# Patient Record
Sex: Male | Born: 1980
Health system: Southern US, Community
[De-identification: ages and names within clinical notes are randomized; demographics above are authoritative.]

## PROBLEM LIST (undated history)

## (undated) DIAGNOSIS — J9801 Acute bronchospasm: Secondary | ICD-10-CM

## (undated) DIAGNOSIS — Z789 Other specified health status: Secondary | ICD-10-CM

## (undated) HISTORY — DX: Other specified health status: Z78.9

## (undated) HISTORY — DX: Acute bronchospasm: J98.01

## (undated) HISTORY — PX: NO PAST SURGERIES: SHX2092

---

## 2012-04-21 HISTORY — PX: CYST REMOVAL TRUNK: SHX6283

## 2016-02-01 ENCOUNTER — Encounter: Payer: Self-pay | Admitting: Family Medicine

## 2016-02-01 ENCOUNTER — Ambulatory Visit (INDEPENDENT_AMBULATORY_CARE_PROVIDER_SITE_OTHER): Payer: BLUE CROSS/BLUE SHIELD | Admitting: Family Medicine

## 2016-02-01 VITALS — BP 108/74 | HR 77 | Temp 98.0°F | Resp 18 | Ht 70.5 in | Wt 164.8 lb

## 2016-02-01 DIAGNOSIS — Z7689 Persons encountering health services in other specified circumstances: Secondary | ICD-10-CM

## 2016-02-01 DIAGNOSIS — J209 Acute bronchitis, unspecified: Secondary | ICD-10-CM | POA: Diagnosis not present

## 2016-02-01 MED ORDER — AMOXICILLIN-POT CLAVULANATE 875-125 MG PO TABS: 1.0000 | ORAL_TABLET | Freq: Two times a day (BID) | ORAL | 0 refills | Status: DC

## 2016-02-01 NOTE — Progress Notes (Signed)
Patient ID: Gregory Knight, male  DOB: 14-Oct-1980, 35 y.o.   MRN: [id number] Patient Care Team    Relationship Specialty Notifications Start End  Natalia Leatherwoodenee A Kuneff, DO PCP - General Family Medicine  02/01/16     Subjective:  Gregory Knight is a 35 y.o.  male present for new patient establishment. All past medical history, surgical history, allergies, family history, immunizations, medications and social history were obtained and entered in the electronic medical record today. All recent labs, ED visits and hospitalizations within the last year were reviewed.  Sore throat: pt presents for new pt establishment with complaints of a  1 week history of  productive cough, headache, sore throat, hoarseness. He denies nasal congestion, runny nose, fever, chills, nausea , vomit or diarrhea. He is eating and drinking well. He has not tried anything to improve his symptoms.   Health maintenance:  Colonoscopy: no Fhx, screen at 50 Immunizations: tdap 2015, Influenza 2015 (encouraged yearly) Infectious disease screening: HIV unknown PSA: No results found for: PSA, No Fx.    Immunization History  Administered Date(s) Administered  . Influenza-Unspecified 01/19/2014     Past Medical History:  Diagnosis Date  . Medical history non-contributory    No Known Allergies Past Surgical History:  Procedure Laterality Date  . NO PAST SURGERIES     Family History  Problem Relation Age of Onset  . Cancer Father     Bile duct   . Stroke Paternal Aunt   . Cancer Paternal Uncle     Bile duct    Social History   Social History  . Marital status: Married    Spouse name: Can Aura CampsCui  . Number of children: 2  . Years of education: PhD   Occupational History  . Actuary    Social History Main Topics  . Smoking status: Never Smoker  . Smokeless tobacco: Never Used  . Alcohol use No  . Drug use: No  . Sexual activity: Yes    Partners: Female    Birth control/ protection: Condom     Comment: married    Other Topics Concern  . Not on file   Social History Narrative   Married to can [place] Crossroadsui, 2 children Zhiyi and CelinaAustin.    PhD, works as an Dance movement psychotherapistActuary.    Wears his seatbelt, bicycle helmet.    Exercises routinely.    Smoke detector in the home.   Feels safe in the relationship.             Medication List       Accurate as of 02/01/16  6:04 PM. Always use your most recent med list.          amoxicillin-clavulanate 875-125 MG tablet Commonly known as:  AUGMENTIN Take 1 tablet by mouth 2 (two) times daily.        No results found for this or any previous visit (from the past 2160 hour(s)).  Patient was never admitted.   ROS: 14 pt review of systems performed and negative (unless mentioned in an HPI)  Objective: BP 108/74 (BP Location: Right Arm, Patient Position: Sitting, Cuff Size: Normal)   Pulse 77   Temp 98 F (36.7 C)   Resp 18   Ht 5' 10.5" (1.791 m)   Wt 164 lb 12 oz (74.7 kg)   SpO2 98%   BMI 23.31 kg/m  Gen: Afebrile. No acute distress. Nontoxic in appearance, well-developed, well-nourished,  Pleasant asian male.  HENT: AT. [place]. Bilateral TM  visualized and normal in appearance, normal external auditory canal. MMM, no oral lesions. Bilateral nares with erythema and swelling, drainage noted.. Throat without erythema, ulcerations or exudates. mild Cough on exam, mild hoarseness on exam. No TTP sinus cavities.  Eyes:Pupils Equal Round Reactive to light, Extraocular movements intact,  Conjunctiva without redness, discharge or icterus. Neck/lymp/endocrine: Supple,no lymphadenopathy CV: RRR  Chest: CTAB, no wheeze, rhonchi or crackles. Abd: Soft. NTND. BS present.  Skin: no rashes, purpura or petechiae. Warm and well-perfused. Skin intact. Neuro/Msk: Normal gait. PERLA. EOMi. Alert. Oriented x3.     Assessment/plan: Gregory Knight is a 35 y.o. male present for establishment of care with acute bronchitis.  Acute bronchitis, unspecified organism - Flonase, mucinex  DM, rest, hydrate - amoxicillin-clavulanate (AUGMENTIN) 875-125 MG tablet; Take 1 tablet by mouth 2 (two) times daily.  Dispense: 20 tablet; Refill: 0 - F/u PRN   Return in about 4 weeks (around 02/29/2016) for CPE. Greater than 30 minutes spent with patient, >50% of time spent face to face counseling patient and coordinating care.  Electronically signed by: Felix Pacini, DO [facility]

## 2016-02-01 NOTE — Patient Instructions (Signed)
It was a pleasure meeting you today. Please tell your wife hello from me.   I think you have bronchitis. I have called in an antibiotic called Augmentin for you to take every 12 hours for 10 days.  I also would suggest you start FLONASE nasal spray daily for about 3-4 weeks.  Use MUCINEX DM for cough and drainage.   Follow up in 2 weeks if not feeling better.      Acute Bronchitis Bronchitis is inflammation of the airways that extend from the windpipe into the lungs (bronchi). The inflammation often causes mucus to develop. This leads to a cough, which is the most common symptom of bronchitis.  In acute bronchitis, the condition usually develops suddenly and goes away over time, usually in a couple weeks. Smoking, allergies, and asthma can make bronchitis worse. Repeated episodes of bronchitis may cause further lung problems.  CAUSES Acute bronchitis is most often caused by the same virus that causes a cold. The virus can spread from person to person (contagious) through coughing, sneezing, and touching contaminated objects. SIGNS AND SYMPTOMS   Cough.   Fever.   Coughing up mucus.   Body aches.   Chest congestion.   Chills.   Shortness of breath.   Sore throat.  DIAGNOSIS  Acute bronchitis is usually diagnosed through a physical exam. Your health care provider will also ask you questions about your medical history. Tests, such as chest X-rays, are sometimes done to rule out other conditions.  TREATMENT  Acute bronchitis usually goes away in a couple weeks. Oftentimes, no medical treatment is necessary. Medicines are sometimes given for relief of fever or cough. Antibiotic medicines are usually not needed but may be prescribed in certain situations. In some cases, an inhaler may be recommended to help reduce shortness of breath and control the cough. A cool mist vaporizer may also be used to help thin bronchial secretions and make it easier to clear the chest.  HOME CARE  INSTRUCTIONS  Get plenty of rest.   Drink enough fluids to keep your urine clear or pale yellow (unless you have a medical condition that requires fluid restriction). Increasing fluids may help thin your respiratory secretions (sputum) and reduce chest congestion, and it will prevent dehydration.   Take medicines only as directed by your health care provider.  If you were prescribed an antibiotic medicine, finish it all even if you start to feel better.  Avoid smoking and secondhand smoke. Exposure to cigarette smoke or irritating chemicals will make bronchitis worse. If you are a smoker, consider using nicotine gum or skin patches to help control withdrawal symptoms. Quitting smoking will help your lungs heal faster.   Reduce the chances of another bout of acute bronchitis by washing your hands frequently, avoiding people with cold symptoms, and trying not to touch your hands to your mouth, nose, or eyes.   Keep all follow-up visits as directed by your health care provider.  SEEK MEDICAL CARE IF: Your symptoms do not improve after 1 week of treatment.  SEEK IMMEDIATE MEDICAL CARE IF:  You develop an increased fever or chills.   You have chest pain.   You have severe shortness of breath.  You have bloody sputum.   You develop dehydration.  You faint or repeatedly feel like you are going to pass out.  You develop repeated vomiting.  You develop a severe headache. MAKE SURE YOU:   Understand these instructions.  Will watch your condition.  Will get help right  away if you are not doing well or get worse.   This information is not intended to replace advice given to you by your health care provider. Make sure you discuss any questions you have with your health care provider.   Document Released: 05/15/2004 Document Revised: 04/28/2014 Document Reviewed: 09/28/2012 Elsevier Interactive Patient Education [employer].

## 2016-02-27 ENCOUNTER — Ambulatory Visit (INDEPENDENT_AMBULATORY_CARE_PROVIDER_SITE_OTHER): Payer: BLUE CROSS/BLUE SHIELD | Admitting: Family Medicine

## 2016-02-27 ENCOUNTER — Encounter: Payer: Self-pay | Admitting: Family Medicine

## 2016-02-27 VITALS — BP 121/80 | HR 58 | Temp 97.7°F | Resp 20 | Ht 71.0 in | Wt 168.8 lb

## 2016-02-27 DIAGNOSIS — Z0000 Encounter for general adult medical examination without abnormal findings: Secondary | ICD-10-CM | POA: Diagnosis not present

## 2016-02-27 DIAGNOSIS — R7989 Other specified abnormal findings of blood chemistry: Secondary | ICD-10-CM

## 2016-02-27 DIAGNOSIS — Z13220 Encounter for screening for lipoid disorders: Secondary | ICD-10-CM | POA: Diagnosis not present

## 2016-02-27 DIAGNOSIS — Z130 Encounter for screening for diseases of the blood and blood-forming organs and certain disorders involving the immune mechanism: Secondary | ICD-10-CM | POA: Diagnosis not present

## 2016-02-27 DIAGNOSIS — Z114 Encounter for screening for human immunodeficiency virus [HIV]: Secondary | ICD-10-CM

## 2016-02-27 DIAGNOSIS — Z131 Encounter for screening for diabetes mellitus: Secondary | ICD-10-CM

## 2016-02-27 DIAGNOSIS — Z800 Family history of malignant neoplasm of digestive organs: Secondary | ICD-10-CM | POA: Diagnosis not present

## 2016-02-27 DIAGNOSIS — Z23 Encounter for immunization: Secondary | ICD-10-CM

## 2016-02-27 LAB — CBC WITH DIFFERENTIAL/PLATELET
BASOS ABS: 0 10*3/uL (ref 0.0–0.1)
Basophils Relative: 0.1 % (ref 0.0–3.0)
EOS ABS: 0.1 10*3/uL (ref 0.0–0.7)
Eosinophils Relative: 1.4 % (ref 0.0–5.0)
HEMATOCRIT: 48.8 % (ref 39.0–52.0)
HEMOGLOBIN: 16.7 g/dL (ref 13.0–17.0)
LYMPHS PCT: 30.4 % (ref 12.0–46.0)
Lymphs Abs: 2.1 10*3/uL (ref 0.7–4.0)
MCHC: 34.3 g/dL (ref 30.0–36.0)
MCV: 93.3 fl (ref 78.0–100.0)
MONOS PCT: 5.4 % (ref 3.0–12.0)
Monocytes Absolute: 0.4 10*3/uL (ref 0.1–1.0)
Neutro Abs: 4.4 10*3/uL (ref 1.4–7.7)
Neutrophils Relative %: 62.7 % (ref 43.0–77.0)
Platelets: 216 10*3/uL (ref 150.0–400.0)
RBC: 5.22 Mil/uL (ref 4.22–5.81)
RDW: 12.4 % (ref 11.5–15.5)
WBC: 7 10*3/uL (ref 4.0–10.5)

## 2016-02-27 LAB — COMPREHENSIVE METABOLIC PANEL
ALBUMIN: 5.1 g/dL (ref 3.5–5.2)
ALK PHOS: 71 U/L (ref 39–117)
ALT: 61 U/L — AB (ref 0–53)
AST: 54 U/L — AB (ref 0–37)
BILIRUBIN TOTAL: 1 mg/dL (ref 0.2–1.2)
BUN: 9 mg/dL (ref 6–23)
CALCIUM: 10.2 mg/dL (ref 8.4–10.5)
CO2: 32 meq/L (ref 19–32)
CREATININE: 0.87 mg/dL (ref 0.40–1.50)
Chloride: 101 mEq/L (ref 96–112)
GFR: 105.51 mL/min (ref >60.00)
Glucose, Bld: 92 mg/dL (ref 70–99)
Potassium: 4.6 mEq/L (ref 3.5–5.1)
Sodium: 140 mEq/L (ref 135–145)
TOTAL PROTEIN: 7.8 g/dL (ref 6.0–8.3)

## 2016-02-27 LAB — URINALYSIS, ROUTINE W REFLEX MICROSCOPIC
BILIRUBIN URINE: NEGATIVE
Ketones, ur: NEGATIVE
LEUKOCYTES UA: NEGATIVE
NITRITE: NEGATIVE
Specific Gravity, Urine: 1.015 (ref 1.000–1.030)
TOTAL PROTEIN, URINE-UPE24: NEGATIVE
URINE GLUCOSE: NEGATIVE
UROBILINOGEN UA: 0.2 (ref 0.0–1.0)
WBC, UA: NONE SEEN (ref 0–?)
pH: 6 (ref 5.0–8.0)

## 2016-02-27 LAB — HEMOGLOBIN A1C: Hgb A1c MFr Bld: 5.9 % (ref 4.6–6.5)

## 2016-02-27 LAB — LDL CHOLESTEROL, DIRECT: LDL DIRECT: 142 mg/dL

## 2016-02-27 LAB — LIPID PANEL
CHOLESTEROL: 227 mg/dL — AB (ref 0–200)
HDL: 51.4 mg/dL (ref >39.00)
NONHDL: 176.03
TRIGLYCERIDES: 276 mg/dL — AB (ref 0.0–149.0)
Total CHOL/HDL Ratio: 4
VLDL: 55.2 mg/dL — ABNORMAL HIGH (ref 0.0–40.0)

## 2016-02-27 NOTE — Patient Instructions (Signed)

## 2016-02-27 NOTE — Progress Notes (Signed)
Patient ID: Gregory Knight, male  DOB: 13-Aug-1980, 35 y.o.   MRN: [id number] Patient Care Team    Relationship Specialty Notifications Start End  Ma Hillock, DO PCP - General Family Medicine  02/01/16     Subjective:  Gregory Knight is a 35 y.o. male present for CPE. All past medical history, surgical history, allergies, family history, immunizations, medications and social history were updated in the electronic medical record today. All recent labs, ED visits and hospitalizations within the last year were reviewed.  Health maintenance:  Colonoscopy: No FHX colon cancer, bile duct cancer present in > 2 family members at age 80.  Immunizations:  tdap UTD 2015, influenza administered today Infectious disease screening: HIV completed today PSA: No Fhx Assistive device: no Oxygen use:no Patient has a Dental home. Hospitalizations/ED visits: No  Immunization History  Administered Date(s) Administered  . Influenza,inj,Quad PF,36+ Mos 02/27/2016  . Influenza-Unspecified 01/19/2014     Past Medical History:  Diagnosis Date  . Medical history non-contributory    No Known Allergies Past Surgical History:  Procedure Laterality Date  . NO PAST SURGERIES     Family History  Problem Relation Age of Onset  . Cancer Father 33    Bile duct   . Stroke Paternal Aunt   . Cancer Paternal Uncle 26    Bile duct    Social History   Social History  . Marital status: Married    Spouse name: Can Gretchen Short  . Number of children: 2  . Years of education: PhD   Occupational History  . Actuary    Social History Main Topics  . Smoking status: Never Smoker  . Smokeless tobacco: Never Used  . Alcohol use No  . Drug use: No  . Sexual activity: Yes    Partners: Female    Birth control/ protection: Condom     Comment: married   Other Topics Concern  . Not on file   Social History Narrative   Married to can [place], 2 children Zhiyi and Nina.    PhD, works as an Marine scientist.    Wears his  seatbelt, bicycle helmet.    Exercises routinely.    Smoke detector in the home.   Feels safe in the relationship.             Medication List    as of 02/27/2016  9:04 AM   You have not been prescribed any medications.      No results found for this or any previous visit (from the past 2160 hour(s)).  Patient was never admitted.   ROS: 14 pt review of systems performed and negative (unless mentioned in an HPI)  Objective: BP 121/80 (BP Location: Right Arm, Patient Position: Sitting, Cuff Size: Normal)   Pulse (!) 58   Temp 97.7 F (36.5 C)   Resp 20   Ht '5\' 11"'  (1.803 m)   Wt 168 lb 12 oz (76.5 kg)   SpO2 99%   BMI 23.54 kg/m  Gen: Afebrile. No acute distress. Nontoxic in appearance, well-developed, well-nourished,  Pleasant asian male.  HENT: AT. [place]. Bilateral TM visualized and normal in appearance, normal external auditory canal. MMM, no oral lesions, adequate dentition. Bilateral nares within normal limits. Throat without erythema, ulcerations or exudates. no Cough on exam, no hoarseness on exam. Eyes:Pupils Equal Round Reactive to light, Extraocular movements intact,  Conjunctiva without redness, discharge or icterus. Neck/lymp/endocrine: Supple,no lymphadenopathy, no thyromegaly CV: RRR no murmur, no edema, +2/4 P posterior  tibialis pulses. no carotid bruitsno. No JVD. Chest: CTAB, no wheeze, rhonchi or crackles. Normal  Respiratory effort. good Air movement. Abd: Soft. flat. NTND. BS present. no Masses palpated. No hepatosplenomegaly. No rebound tenderness or guarding. Skin: no rashes, purpura or petechiae. Warm and well-perfused. Skin intact. Neuro/Msk:  Normal gait. PERLA. EOMi. Alert. Oriented x3.  Cranial nerves II through XII intact. Muscle strength 5/5 upper/lower extremity. DTRs equal bilaterally. Psych: Normal affect, dress and demeanor. Normal speech. Normal thought content and judgment.   Assessment/plan: Gregory Knight is a 35 y.o. male present for  CPE Encounter for preventive health examination Patient was encouraged to exercise greater than 150 minutes a week. Patient was encouraged to choose a diet filled with fresh fruits and vegetables, and lean meats. AVS provided to patient today for education/recommendation on gender specific health and safety maintenance. Colonoscopy: No FHX colon cancer, bile duct cancer present in > 2 family members at age 73.  Immunizations:  tdap UTD 2015, influenza administered today Infectious disease screening: HIV completed today PSA: No Fhx Screening cholesterol level - Comp Met (CMET) - Lipid panel Diabetes mellitus screening - HgB A1c Screening for deficiency anemia - CBC w/Diff Encounter for screening for HIV - HIV antibody (with reflex) Family history of cancer of extrahepatic bile ducts - Urinalysis, Routine w reflex microscopic Need for prophylactic vaccination and inoculation against influenza - Flu Vaccine QUAD 36+ mos PF IM (Fluarix & Fluzone Quad PF)  Return in about 1 year (around 02/26/2017) for CPE.  Electronically signed by: Howard Pouch, DO [place]

## 2016-02-28 ENCOUNTER — Telehealth: Payer: Self-pay | Admitting: Family Medicine

## 2016-02-28 DIAGNOSIS — Z800 Family history of malignant neoplasm of digestive organs: Secondary | ICD-10-CM

## 2016-02-28 DIAGNOSIS — R748 Abnormal levels of other serum enzymes: Secondary | ICD-10-CM | POA: Insufficient documentation

## 2016-02-28 LAB — HIV ANTIBODY (ROUTINE TESTING W REFLEX): HIV 1&2 Ab, 4th Generation: NONREACTIVE

## 2016-02-28 NOTE — Telephone Encounter (Signed)
Please call pt:  - his labs have resulted with elevated liver enzymes, elevated cholesterol and elevated diabetes screen (but not diabetic range).  - His LFTs are only mildly elevated but considering his fhx of bile duct cancer, I have went ahead and ordered an US of his liver. He will need a follow appt with me 2 days after US completed to review all labs and imaging reports and discuss plan (30 minute slot). It is important for him to understand there are many causes of mildly elevated LFT, including higher cholesterol. We are being more cautious and moving quickly in work up with him, because of his fhx,   Please place order for US ABD complete, once pt is contacted with the 2 problems in his problem list associated at [facility].

## 2016-02-29 NOTE — Telephone Encounter (Signed)
Spoke with patient recommendation is to get US due to family history .

## 2016-02-29 NOTE — Telephone Encounter (Signed)
Patient returning call to Gregory ChessmanSuzanne, advised she was with a patient at the time.  Please return call to patient at [phone].

## 2016-02-29 NOTE — Telephone Encounter (Signed)
Order for US placed

## 2016-02-29 NOTE — Telephone Encounter (Signed)
Called patient  Mailbox full unable to leave message

## 2016-02-29 NOTE — Telephone Encounter (Signed)
Spoke with patient reviewed lab results and information. 

## 2016-02-29 NOTE — Telephone Encounter (Signed)
Patient calling to inquire if it is medically necessary for him to have his abdominal ultrasound performed now or would it be ok for him to wait until January to have it done.

## 2016-03-02 ENCOUNTER — Ambulatory Visit (HOSPITAL_BASED_OUTPATIENT_CLINIC_OR_DEPARTMENT_OTHER)
Admission: RE | Admit: 2016-03-02 | Discharge: 2016-03-02 | Disposition: A | Discharge status: Home / Self Care | Payer: BLUE CROSS/BLUE SHIELD | Source: Ambulatory Visit | Attending: Family Medicine | Admitting: Family Medicine

## 2016-03-02 DIAGNOSIS — K760 Fatty (change of) liver, not elsewhere classified: Secondary | ICD-10-CM | POA: Insufficient documentation

## 2016-03-02 DIAGNOSIS — Z800 Family history of malignant neoplasm of digestive organs: Secondary | ICD-10-CM | POA: Diagnosis present

## 2016-03-02 DIAGNOSIS — R748 Abnormal levels of other serum enzymes: Secondary | ICD-10-CM | POA: Diagnosis present

## 2016-03-03 ENCOUNTER — Telehealth: Payer: Self-pay | Admitting: Family Medicine

## 2016-03-03 NOTE — Telephone Encounter (Signed)
Please make certain pt hs f/u schedule for US results and discuss abnormal labs and recommendations.

## 2016-03-04 NOTE — Telephone Encounter (Signed)
appt scheduled

## 2016-03-07 ENCOUNTER — Ambulatory Visit (INDEPENDENT_AMBULATORY_CARE_PROVIDER_SITE_OTHER): Payer: BLUE CROSS/BLUE SHIELD | Admitting: Family Medicine

## 2016-03-07 ENCOUNTER — Encounter: Payer: Self-pay | Admitting: Family Medicine

## 2016-03-07 VITALS — BP 117/82 | HR 72 | Temp 98.6°F | Resp 16 | Ht 71.0 in | Wt 167.0 lb

## 2016-03-07 DIAGNOSIS — Z800 Family history of malignant neoplasm of digestive organs: Secondary | ICD-10-CM | POA: Diagnosis not present

## 2016-03-07 DIAGNOSIS — R748 Abnormal levels of other serum enzymes: Secondary | ICD-10-CM

## 2016-03-07 DIAGNOSIS — K760 Fatty (change of) liver, not elsewhere classified: Secondary | ICD-10-CM

## 2016-03-07 DIAGNOSIS — E785 Hyperlipidemia, unspecified: Secondary | ICD-10-CM | POA: Diagnosis not present

## 2016-03-07 DIAGNOSIS — J9801 Acute bronchospasm: Secondary | ICD-10-CM

## 2016-03-07 DIAGNOSIS — R7303 Prediabetes: Secondary | ICD-10-CM

## 2016-03-07 MED ORDER — ALBUTEROL SULFATE HFA 108 (90 BASE) MCG/ACT IN AERS
2.0000 | INHALATION_SPRAY | Freq: Four times a day (QID) | RESPIRATORY_TRACT | 0 refills | Status: DC | PRN
Start: 2016-03-07 — End: 2017-11-10

## 2016-03-07 NOTE — Patient Instructions (Signed)
Repeat labs in 4-6 months.  Repeat Ultrasound in 1 years.  Lower carbohydrate/sweets in your diet, exercise > 150 minutes a week.  Start fish oil 2g daily.

## 2016-03-07 NOTE — Progress Notes (Signed)
Gregory Knight , 1981-03-19, 35 y.o., male MRN: [id number] Patient Care Team    Relationship Specialty Notifications Start End  Natalia Leatherwoodenee A Kuneff, DO PCP - General Family Medicine  02/01/16     CC: abnormal labs and US Subjective: Pt presents for an OV to discuss his abnormal labs and abnormal abdominal US.   Abnormal LFT/ hyperlipidemia/FH bile duct cancer/ abnormal US: On cpe labs pt found to have elevated LFT 54/61 (ast/ALT), elevated cholesterol 227, trig 276, LDL 142. Urine negative for bili, or abnormality. Pt is symptomatic denies abdominal pain, bloating or changes in urine or stool. He has a rather strong FH with multiple members passing away from bile duct cancer, of which kind he is uncertain. US with increased echogenicity, no intrahepatic lesions and a bile duct diameter of 5.8 mm.   Prediabetes: pt found to have abnormal a1c on 5.9 on CPE exam. He denies polydipsia, polyphagia or polyuria. He reported a "darker yellow" colored urine on exam. Urine was normal.   ABD US 03/02/2016: EXAM: ABDOMEN ULTRASOUND COMPLETE COMPARISON:  None. FINDINGS: Gallbladder: No gallstones or wall thickening visualized. No sonographic Murphy sign noted by sonographer. Common bile duct: Diameter: 5.8 mm Liver: Diffusely increased in echogenicity. No focal lesion identified. IVC: No abnormality visualized. Pancreas: Visualized portion unremarkable. Spleen: Size and appearance within normal limits. Right Kidney: Length: 11.7 cm. Echogenicity within normal limits. No mass or hydronephrosis visualized. Left Kidney: Length: 12.1 cm. Echogenicity within normal limits. No mass or hydronephrosis visualized. Abdominal aorta: No aneurysm visualized. Other findings: None. IMPRESSION: Hepatic steatosis. No cholelithiasis or sonographic evidence for acute cholecystitis  No Known Allergies Social History  Substance Use Topics  . Smoking status: Never Smoker  . Smokeless tobacco: Never Used  .  Alcohol use No   Past Medical History:  Diagnosis Date  . Bronchospasm   . Medical history non-contributory    Past Surgical History:  Procedure Laterality Date  . NO PAST SURGERIES     Family History  Problem Relation Age of Onset  . Cancer Father 3350    Bile duct   . Stroke Paternal Aunt   . Cancer Paternal Uncle 1350    Bile duct      Medication List       Accurate as of 03/07/16  6:16 PM. Always use your most recent med list.          albuterol 108 (90 Base) MCG/ACT inhaler Commonly known as:  PROVENTIL HFA;VENTOLIN HFA Inhale 2 puffs into the lungs every 6 (six) hours as needed for wheezing or shortness of breath.       No results found for this or any previous visit (from the past 24 hour(s)). No results found.   ROS: Negative, with the exception of above mentioned in HPI   Objective:  BP 117/82 (BP Location: Left Arm, Patient Position: Sitting, Cuff Size: Normal)   Pulse 72   Temp 98.6 F (37 C) (Temporal)   Resp 16   Ht 5\' 11"  (1.803 m)   Wt 167 lb (75.8 kg)   SpO2 96%   BMI 23.29 kg/m  Body mass index is 23.29 kg/m. Gen: Afebrile. No acute distress. Nontoxic in appearance, well developed, well nourished.  HENT: AT. [place]. MMM, no oral lesions.  Eyes:Pupils Equal Round Reactive to light, Extraocular movements intact,  Conjunctiva without redness, discharge or icterus. Neuro:  Normal gait. PERLA. EOMi. Alert. Oriented x3  Psych: Normal affect, dress and demeanor. Normal speech. Normal thought content and  judgment.   Assessment/Plan: Gregory Knight is a 35 y.o. male present for  OV for   Bronchospasm - discussed treatment for intermittent bronchospasm with albuterol. He had flovent and used PRN.  - albuterol prescribed, if needing greater than 2x a week, will need to consider PFT.   Elevated liver enzymes/Family history of malignant neoplasm of bile ducts Hyperlipidemia LDL goal <100 Hepatic steatosis - discussed results with pt in detail. US without  intrahepatic lesion, however bile duct diameter is 5.8. Discussed reccommended cut-off for concern > 6 mm. - recommended he consider Hep B vaccine series if he has not already received. He will let us know if he wants to start after thinking about it.   - encouraged him to make dietary and exercise changes.  - avoidance of liver toxic agents, he does not drink any alcohol.  - Start fish oil supplement 2g daily.  - repeat LFT in 4-6 months. Monitor for any changes in urine, bowels or abd pain.  - rpt US in 1 year. Sooner if LFT worsening on repeat, consider GI referral.    Prediabetes - a1c 5.9 - discussed low carb/sugar diet. Healthy carbs.  - increase exercise to > 150 minutes a week - F/u 4-6 months for repeat.    > 25 minutes spent with patient, >50% of time spent face to face   electronically signed by:  Felix Pacinienee Kuneff, DO  [facility]

## 2016-03-07 NOTE — Progress Notes (Signed)
Pre visit review using our clinic review tool, if applicable. No additional management support is needed unless otherwise documented below in the visit note. 

## 2017-11-10 ENCOUNTER — Ambulatory Visit (INDEPENDENT_AMBULATORY_CARE_PROVIDER_SITE_OTHER): Payer: BLUE CROSS/BLUE SHIELD | Admitting: Family Medicine

## 2017-11-10 ENCOUNTER — Encounter: Payer: Self-pay | Admitting: Family Medicine

## 2017-11-10 ENCOUNTER — Telehealth: Payer: Self-pay | Admitting: Family Medicine

## 2017-11-10 VITALS — BP 103/68 | HR 72 | Temp 97.7°F | Resp 20 | Ht 71.0 in | Wt 151.0 lb

## 2017-11-10 DIAGNOSIS — E785 Hyperlipidemia, unspecified: Secondary | ICD-10-CM

## 2017-11-10 DIAGNOSIS — K760 Fatty (change of) liver, not elsewhere classified: Secondary | ICD-10-CM

## 2017-11-10 DIAGNOSIS — R748 Abnormal levels of other serum enzymes: Secondary | ICD-10-CM

## 2017-11-10 DIAGNOSIS — R7303 Prediabetes: Secondary | ICD-10-CM | POA: Diagnosis not present

## 2017-11-10 DIAGNOSIS — Z800 Family history of malignant neoplasm of digestive organs: Secondary | ICD-10-CM

## 2017-11-10 DIAGNOSIS — Z130 Encounter for screening for diseases of the blood and blood-forming organs and certain disorders involving the immune mechanism: Secondary | ICD-10-CM

## 2017-11-10 DIAGNOSIS — Z0000 Encounter for general adult medical examination without abnormal findings: Secondary | ICD-10-CM

## 2017-11-10 LAB — CBC WITH DIFFERENTIAL/PLATELET
Basophils Absolute: 0 10*3/uL (ref 0.0–0.1)
Basophils Relative: 0.1 % (ref 0.0–3.0)
EOS ABS: 0.1 10*3/uL (ref 0.0–0.7)
EOS PCT: 1.2 % (ref 0.0–5.0)
HCT: 43.6 % (ref 39.0–52.0)
Hemoglobin: 15.2 g/dL (ref 13.0–17.0)
LYMPHS ABS: 1.9 10*3/uL (ref 0.7–4.0)
Lymphocytes Relative: 36.1 % (ref 12.0–46.0)
MCHC: 34.9 g/dL (ref 30.0–36.0)
MCV: 92.8 fl (ref 78.0–100.0)
MONO ABS: 0.3 10*3/uL (ref 0.1–1.0)
Monocytes Relative: 5 % (ref 3.0–12.0)
NEUTROS PCT: 57.6 % (ref 43.0–77.0)
Neutro Abs: 3 10*3/uL (ref 1.4–7.7)
Platelets: 221 10*3/uL (ref 150.0–400.0)
RBC: 4.69 Mil/uL (ref 4.22–5.81)
RDW: 12.2 % (ref 11.5–15.5)
WBC: 5.2 10*3/uL (ref 4.0–10.5)

## 2017-11-10 LAB — COMPREHENSIVE METABOLIC PANEL
ALT: 16 U/L (ref 0–53)
AST: 12 U/L (ref 0–37)
Albumin: 4.7 g/dL (ref 3.5–5.2)
Alkaline Phosphatase: 78 U/L (ref 39–117)
BUN: 12 mg/dL (ref 6–23)
CHLORIDE: 102 meq/L (ref 96–112)
CO2: 33 mEq/L — ABNORMAL HIGH (ref 19–32)
Calcium: 9.6 mg/dL (ref 8.4–10.5)
Creatinine, Ser: 0.87 mg/dL (ref 0.40–1.50)
GFR: 104.53 mL/min (ref >60.00)
GLUCOSE: 102 mg/dL — AB (ref 70–99)
POTASSIUM: 3.9 meq/L (ref 3.5–5.1)
SODIUM: 140 meq/L (ref 135–145)
Total Bilirubin: 0.7 mg/dL (ref 0.2–1.2)
Total Protein: 7.4 g/dL (ref 6.0–8.3)

## 2017-11-10 LAB — LIPID PANEL
Cholesterol: 170 mg/dL (ref 0–200)
HDL: 35.8 mg/dL — ABNORMAL LOW (ref >39.00)
Total CHOL/HDL Ratio: 5
Triglycerides: 405 mg/dL — ABNORMAL HIGH (ref 0.0–149.0)

## 2017-11-10 LAB — HEMOGLOBIN A1C: HEMOGLOBIN A1C: 5.8 % (ref 4.6–6.5)

## 2017-11-10 LAB — LDL CHOLESTEROL, DIRECT: Direct LDL: 83 mg/dL

## 2017-11-10 LAB — TSH: TSH: 1.33 u[IU]/mL (ref 0.35–4.50)

## 2017-11-10 MED ORDER — FENOFIBRATE 145 MG PO TABS: 145.0000 mg | ORAL_TABLET | Freq: Every day | ORAL | 3 refills | Status: DC

## 2017-11-10 NOTE — Telephone Encounter (Signed)
Please inform patient the following information: I am still waiting on the hepatitis panel results. However, I wanted to inform him of his triglycerides (part of cholesterol panel) are extremely elevated and he needs a medicine to bring these down. I wanted to get the med called in for him, in a 90 day script so he could get it before his insurance is interrupted next week.  This med is a fiber based medication. Needs to be taken daily. The rest of his test so far are all negative.   Continue fish oil supplement as well. Follow up in 3 months with provider- fasting at least 6 hours please (water ok).

## 2017-11-10 NOTE — Progress Notes (Signed)
Patient ID: Gregory Knight, male  DOB: 1980/06/04, 37 y.o.   MRN: [id number] Patient Care Team    Relationship Specialty Notifications Start End  Natalia Leatherwood, DO PCP - General Family Medicine  02/01/16     Chief Complaint  Patient presents with  . Annual Exam    Subjective:  Gregory Knight is a 37 y.o. male present for CPE. All past medical history, surgical history, allergies, family history, immunizations, medications and social history were updated in the electronic medical record today. All recent labs, ED visits and hospitalizations within the last year were reviewed.  Abnormal LFT/ hyperlipidemia/FH bile duct cancer/ abnormal [place]: On cpe  Labs 2 years ago pt found to have elevated LFT 54/61 (ast/ALT), elevated cholesterol 227, trig 276, LDL 142. Urine negative for bili, or abnormality. Pt is still asymptomatic, denies abdominal pain, bloating or changes in urine or stool. He has a rather strong FH with multiple members passing away from bile duct cancer, of which kind he is uncertain. [place] with increased echogenicity, no intrahepatic lesions and a bile duct diameter of 5.8 mm (2017).  Prediabetes: pt found to have abnormal a1c on 5.9 on CPE exam 2 years ago, no follow up.    Health maintenance: updated 11/10/17 Colonoscopy: No FHX colon cancer, bile duct cancer present in > 2 family members at age 51.  Immunizations:  tdap UTD 2015, influenza administered yearly encouraged Infectious disease screening: HIV completed  PSA: No Fhx- routine screen Assistive device: no Oxygen use:no Patient has a Dental home. Hospitalizations/ED visits: No Assistive device: none Oxygen NWG:NFAO Patient has a Dental home. Hospitalizations/ED visits: reviewed  ABD [place] 03/02/2016: EXAM: ABDOMEN ULTRASOUND COMPLETE COMPARISON: None. FINDINGS: Gallbladder: No gallstones or wall thickening visualized. No sonographic Murphy sign noted by sonographer. Common bile duct: Diameter: 5.8 mm Liver:  Diffusely increased in echogenicity. No focal lesion identified. IVC: No abnormality visualized. Pancreas: Visualized portion unremarkable. Spleen: Size and appearance within normal limits. Right Kidney: Length: 11.7 cm. Echogenicity within normal limits. No mass or hydronephrosis visualized. Left Kidney: Length: 12.1 cm. Echogenicity within normal limits. No mass or hydronephrosis visualized. Abdominal aorta: No aneurysm visualized. Other findings: None. IMPRESSION: Hepatic steatosis. No cholelithiasis or sonographic evidence for acute cholecystitis   Depression screen [facility] 2/9 11/10/2017 02/27/2016 02/01/2016  Decreased Interest 0 0 0  Down, Depressed, Hopeless 0 0 0  PHQ - 2 Score 0 0 0   No flowsheet data found.   Current Exercise Habits: Structured exercise class, Time (Minutes): 60, Frequency (Times/Week): 3, Weekly Exercise (Minutes/Week): 180, Intensity: Moderate Exercise limited by: None identified Fall Risk  11/10/2017 02/27/2016 02/01/2016  Falls in the past year? No No No      Immunization History  Administered Date(s) Administered  . Influenza,inj,Quad PF,6+ Mos 02/27/2016, 07/24/2017  . Influenza-Unspecified 01/19/2014     Past Medical History:  Diagnosis Date  . Bronchospasm   . Medical history non-contributory    No Known Allergies Past Surgical History:  Procedure Laterality Date  . NO PAST SURGERIES     Family History  Problem Relation Age of Onset  . Cancer Father 83       Bile duct   . Stroke Paternal Aunt   . Cancer Paternal Uncle 65       Bile duct    Social History   Socioeconomic History  . Marital status: Married    Spouse name: Can Aura Camps  . Number of children: 2  . Years of education: PhD  .  Highest education level: Not on file  Occupational History  . Occupation: Engineering geologist  . Financial resource strain: Not on file  . Food insecurity:    Worry: Not on file    Inability: Not on file  . Transportation needs:     Medical: Not on file    Non-medical: Not on file  Tobacco Use  . Smoking status: Never Smoker  . Smokeless tobacco: Never Used  Substance and Sexual Activity  . Alcohol use: No  . Drug use: No  . Sexual activity: Yes    Partners: Female    Birth control/protection: Condom    Comment: married  Lifestyle  . Physical activity:    Days per week: Not on file    Minutes per session: Not on file  . Stress: Not on file  Relationships  . Social connections:    Talks on phone: Not on file    Gets together: Not on file    Attends religious service: Not on file    Active member of club or organization: Not on file    Attends meetings of clubs or organizations: Not on file    Relationship status: Not on file  . Intimate partner violence:    Fear of current or ex partner: Not on file    Emotionally abused: Not on file    Physically abused: Not on file    Forced sexual activity: Not on file  Other Topics Concern  . Not on file  Social History Narrative   Married to can [place], 2 children [place] and [place].    PhD, works as an Dance movement psychotherapist.    Wears his seatbelt, bicycle helmet.    Exercises routinely.    Smoke detector in the home.   Feels safe in the relationship.        Allergies as of 11/10/2017   No Known Allergies     Medication List    as of 11/10/2017  2:17 PM   You have not been prescribed any medications.    All past medical history, surgical history, allergies, family history, immunizations andmedications were updated in the EMR today and reviewed under the history and medication portions of their EMR.     No results found for this or any previous visit (from the past 2160 hour(s)).  US Abdomen Complete  Result Date: 03/02/2016 CLINICAL DATA:  Patient with elevated LFTs. Family history of cholangiocarcinoma. EXAM: ABDOMEN ULTRASOUND COMPLETE COMPARISON:  None. FINDINGS: Gallbladder: No gallstones or wall thickening visualized. No sonographic Murphy sign noted by  sonographer. Common bile duct: Diameter: 5.8 mm Liver: Diffusely increased in echogenicity. No focal lesion identified. IVC: No abnormality visualized. Pancreas: Visualized portion unremarkable. Spleen: Size and appearance within normal limits. Right Kidney: Length: 11.7 cm. Echogenicity within normal limits. No mass or hydronephrosis visualized. Left Kidney: Length: 12.1 cm. Echogenicity within normal limits. No mass or hydronephrosis visualized. Abdominal aorta: No aneurysm visualized. Other findings: None. IMPRESSION: Hepatic steatosis. No cholelithiasis or sonographic evidence for acute cholecystitis. Electronically Signed   By: Annia Belt M.D.   On: 03/02/2016 13:22     ROS: 14 pt review of systems performed and negative (unless mentioned in an HPI)  Objective: BP 103/68 (BP Location: Right Arm, Patient Position: Sitting, Cuff Size: Normal)   Pulse 72   Temp 97.7 F (36.5 C)   Resp 20   Ht 5\' 11"  (1.803 m)   Wt 151 lb (68.5 kg)   SpO2 98%   BMI 21.06 kg/m  Gen: Afebrile. No acute distress. Nontoxic in appearance, well-developed, well-nourished,  Pleasant asian male.  HENT: AT. [place]. Bilateral TM visualized and normal in appearance, normal external auditory canal. MMM, no oral lesions, adequate dentition. Bilateral nares within normal limits. Throat without erythema, ulcerations or exudates. no Cough on exam, no hoarseness on exam. Eyes:Pupils Equal Round Reactive to light, Extraocular movements intact,  Conjunctiva without redness, discharge or icterus. Neck/lymp/endocrine: Supple,no lymphadenopathy, no thyromegaly CV: RRR no murmur, no edema, +2/4 P posterior tibialis pulses. no carotid bruits. No JVD. Chest: CTAB, no wheeze, rhonchi or crackles. Normal  Respiratory effort. good Air movement. Abd: Soft. flat. NTND. BS present. no Masses palpated. No hepatosplenomegaly. No rebound tenderness or guarding. Skin: no rashes, purpura or petechiae. Warm and well-perfused. Skin  intact. Neuro/Msk:  Normal gait. PERLA. EOMi. Alert. Oriented x3.  Cranial nerves II through XII intact. Muscle strength 5/5 upper/lower extremity. DTRs equal bilaterally. Psych: Normal affect, dress and demeanor. Normal speech. Normal thought content and judgment.  No exam data present  Assessment/plan: Gregory Knight is a 37 y.o. male present for CPE Elevated liver enzymes/Family history of malignant neoplasm of bile ducts/Hepatic steatosis - last US 2 years ago without intrahepatic lesion, however bile duct diameter is 5.8. Discussed reccommended cut-off for concern > 6 mm. - recommended he consider Hep B vaccine series--> if he decides to get them he will call in for a nurse visit to start.  - avoidance of liver toxic agents, he does not drink any alcohol.  - hep panel, AFP, cmp ordered today - repeat ABD US ordered--> consider GI referral.  - rpt US in 1 year. Sooner if LFT worsening on repeat, consider GI referral.   Prediabetes - last a1c 5.9 - discussed low carb/sugar diet. Healthy carbs.  - increase exercise to > 150 minutes a week - Rpt a1c today.  Hyperlipidemia LDL goal <100 - Continue fish oil supplement 2g daily.  - encouraged him to make dietary and exercise changes.  - Lipid panel - TSH Screening for deficiency anemia - CBC w/Diff Encounter for preventive health examination Patient was encouraged to exercise greater than 150 minutes a week. Patient was encouraged to choose a diet filled with fresh fruits and vegetables, and lean meats. AVS provided to patient today for education/recommendation on gender specific health and safety maintenance. Colonoscopy: No FHX colon cancer, bile duct cancer present in > 2 family members at age 89.  Immunizations:  tdap UTD 2015, influenza administered yearly encouraged Infectious disease screening: HIV completed  PSA: No Fhx- routine screen   If he decides he wants to start the hepatitis B vaccine series (3 in the series), he may do  so by nurse appt.   Return in about 1 year (around 11/11/2018) for CPE.  Note is dictated utilizing voice recognition software. Although note has been proof read prior to signing, occasional typographical errors still can be missed. If any questions arise, please do not hesitate to call for verification.  Electronically signed by: Felix Pacinienee Aadya Kindler, DO [facility]

## 2017-11-10 NOTE — Patient Instructions (Signed)
I have ordered the repeat Ultrasound of your liver. They will call to schedule you this week.  We will call you with lab results once we get them.   If you decide you want the hepatitis B vaccine series (3 in the series), just call in for a nurse visit and we will start them for you.    Health Maintenance, Male A healthy lifestyle and preventive care is important for your health and wellness. Ask your health care provider about what schedule of regular examinations is right for you. What should I know about weight and diet? Eat a Healthy Diet  Eat plenty of vegetables, fruits, whole grains, low-fat dairy products, and lean protein.  Do not eat a lot of foods high in solid fats, added sugars, or salt.  Maintain a Healthy Weight Regular exercise can help you achieve or maintain a healthy weight. You should:  Do at least 150 minutes of exercise each week. The exercise should increase your heart rate and make you sweat (moderate-intensity exercise).  Do strength-training exercises at least twice a week.  Watch Your Levels of Cholesterol and Blood Lipids  Have your blood tested for lipids and cholesterol every 5 years starting at 38 years of age. If you are at high risk for heart disease, you should start having your blood tested when you are 38 years old. You may need to have your cholesterol levels checked more often if: ? Your lipid or cholesterol levels are high. ? You are older than 38 years of age. ? You are at high risk for heart disease.  What should I know about cancer screening? Many types of cancers can be detected early and may often be prevented. Lung Cancer  You should be screened every year for lung cancer if: ? You are a current smoker who has smoked for at least 30 years. ? You are a former smoker who has quit within the past 15 years.  Talk to your health care provider about your screening options, when you should start screening, and how often you should be  screened.  Colorectal Cancer  Routine colorectal cancer screening usually begins at 38 years of age and should be repeated every 5-10 years until you are 38 years old. You may need to be screened more often if early forms of precancerous polyps or small growths are found. Your health care provider may recommend screening at an earlier age if you have risk factors for colon cancer.  Your health care provider may recommend using home test kits to check for hidden blood in the stool.  A small camera at the end of a tube can be used to examine your colon (sigmoidoscopy or colonoscopy). This checks for the earliest forms of colorectal cancer.  Prostate and Testicular Cancer  Depending on your age and overall health, your health care provider may do certain tests to screen for prostate and testicular cancer.  Talk to your health care provider about any symptoms or concerns you have about testicular or prostate cancer.  Skin Cancer  Check your skin from head to toe regularly.  Tell your health care provider about any new moles or changes in moles, especially if: ? There is a change in a mole's size, shape, or color. ? You have a mole that is larger than a pencil eraser.  Always use sunscreen. Apply sunscreen liberally and repeat throughout the day.  Protect yourself by wearing long sleeves, pants, a wide-brimmed hat, and sunglasses when outside.  What  should I know about heart disease, diabetes, and high blood pressure?  If you are 4318-38 years of age, have your blood pressure checked every 3-5 years. If you are 38 years of age or older, have your blood pressure checked every year. You should have your blood pressure measured twice-once when you are at a hospital or clinic, and once when you are not at a hospital or clinic. Record the average of the two measurements. To check your blood pressure when you are not at a hospital or clinic, you can use: ? An automated blood pressure machine at a  pharmacy. ? A home blood pressure monitor.  Talk to your health care provider about your target blood pressure.  If you are between 2245-38 years old, ask your health care provider if you should take aspirin to prevent heart disease.  Have regular diabetes screenings by checking your fasting blood sugar level. ? If you are at a normal weight and have a low risk for diabetes, have this test once every three years after the age of 38. ? If you are overweight and have a high risk for diabetes, consider being tested at a younger age or more often.  A one-time screening for abdominal aortic aneurysm (AAA) by ultrasound is recommended for men aged 65-75 years who are current or former smokers. What should I know about preventing infection? Hepatitis B If you have a higher risk for hepatitis B, you should be screened for this virus. Talk with your health care provider to find out if you are at risk for hepatitis B infection. Hepatitis C Blood testing is recommended for:  Everyone born from [birth year] through 1965.  Anyone with known risk factors for hepatitis C.  Sexually Transmitted Diseases (STDs)  You should be screened each year for STDs including gonorrhea and chlamydia if: ? You are sexually active and are younger than 38 years of age. ? You are older than 38 years of age and your health care provider tells you that you are at risk for this type of infection. ? Your sexual activity has changed since you were last screened and you are at an increased risk for chlamydia or gonorrhea. Ask your health care provider if you are at risk.  Talk with your health care provider about whether you are at high risk of being infected with HIV. Your health care provider may recommend a prescription medicine to help prevent HIV infection.  What else can I do?  Schedule regular health, dental, and eye exams.  Stay current with your vaccines (immunizations).  Do not use any tobacco products, such as  cigarettes, chewing tobacco, and e-cigarettes. If you need help quitting, ask your health care provider.  Limit alcohol intake to no more than 2 drinks per day. One drink equals 12 ounces of beer, 5 ounces of wine, or 1 ounces of hard liquor.  Do not use street drugs.  Do not share needles.  Ask your health care provider for help if you need support or information about quitting drugs.  Tell your health care provider if you often feel depressed.  Tell your health care provider if you have ever been abused or do not feel safe at home. This information is not intended to replace advice given to you by your health care provider. Make sure you discuss any questions you have with your health care provider. Document Released: 10/04/2007 Document Revised: 12/05/2015 Document Reviewed: 01/09/2015 Elsevier Interactive Patient Education  [employer]2018 Elsevier Inc[employer]

## 2017-11-11 LAB — HEPATITIS PANEL, ACUTE
HEP C AB: NONREACTIVE
Hep A IgM: NONREACTIVE
Hep B C IgM: NONREACTIVE
Hepatitis B Surface Ag: NONREACTIVE
SIGNAL TO CUT-OFF: 0.04 (ref <1.00)

## 2017-11-11 LAB — AFP TUMOR MARKER: AFP TUMOR MARKER: 1.3 ng/mL (ref <6.1)

## 2017-11-11 NOTE — Telephone Encounter (Signed)
Patient notified and verbalized understanding. 

## 2017-11-12 ENCOUNTER — Ambulatory Visit (HOSPITAL_BASED_OUTPATIENT_CLINIC_OR_DEPARTMENT_OTHER)
Admission: RE | Admit: 2017-11-12 | Discharge: 2017-11-12 | Disposition: A | Discharge status: Home / Self Care | Payer: BLUE CROSS/BLUE SHIELD | Source: Ambulatory Visit | Attending: Family Medicine | Admitting: Family Medicine

## 2017-11-12 ENCOUNTER — Telehealth: Payer: Self-pay | Admitting: *Deleted

## 2017-11-12 DIAGNOSIS — Z800 Family history of malignant neoplasm of digestive organs: Secondary | ICD-10-CM | POA: Insufficient documentation

## 2017-11-12 DIAGNOSIS — K760 Fatty (change of) liver, not elsewhere classified: Secondary | ICD-10-CM | POA: Diagnosis not present

## 2017-11-12 DIAGNOSIS — R748 Abnormal levels of other serum enzymes: Secondary | ICD-10-CM | POA: Diagnosis present

## 2017-11-12 NOTE — Telephone Encounter (Signed)
Pt calling and states he was not fasting during his labs and want to know if the report could have been off due to this?

## 2017-11-12 NOTE — Telephone Encounter (Signed)
Called patient left message for patient to return callCopied from CRM [id number]. Topic: Quick Communication - See Telephone Encounter >> Nov 11, 2017  3:05 PM Raquel SarnaHayes, Teresa G wrote: Pt needing Hep B and C vaccines.  Pt needing the prices for them. Please call pt back to give total cost. >> Nov 12, 2017 11:59 AM Harmon PierFulton, Amanda I wrote: There are two different Hep B's one is plain and one is a Hep A/B combo. There is no Hep C vaccine.  Plain Hep B is $100- may be a series Hep A/B is 175-may be a series  Admin fee is $49 each >> Nov 12, 2017 12:46 PM Tomerlin, Diane S wrote: Rosalita ChessmanSuzanne I'm getting ready to call the patient back. If he wants to schedule an appointment may I put him on the schedule as a nurse visit?

## 2017-11-13 NOTE — Telephone Encounter (Signed)
Concerning his question about fasting: -His triglycerides were elevated last year as well during his fasting appointment.  They are elevated this year although he was not fasting.   -It is a valid question.  Until recently cholesterol was always checked fasting.  In the last year, studies provided by the heart association states that fasting labs are no longer needed to predict cardiovascular risk and cholesterol levels, and nonfasting labs are equal to, and may be even better predictive value of cardiovascular risk and accurate cholesterol levels.  - So the short answer is... It should not matter.  I hope that helps.

## 2017-11-13 NOTE — Telephone Encounter (Signed)
Spoke with patient reviewed information and instructions. Reviewed results of US. Patient verbalized understanding.

## 2019-07-13 ENCOUNTER — Encounter: Payer: BLUE CROSS/BLUE SHIELD | Admitting: Family Medicine

## 2020-01-23 IMAGING — US US ABDOMEN COMPLETE
1 series · 14 of 25 positions shown · non-contrast
Comparison: Prior ultrasound from 03/02/2016.

CLINICAL DATA: Initial evaluation for elevated LFTs. Family history
of biliary carcinoma.

EXAM:
ABDOMEN ULTRASOUND COMPLETE

[Series 1: us abdomen complete · 0.15mm/px · 14 of 73 slices shown]
[im 1/73]
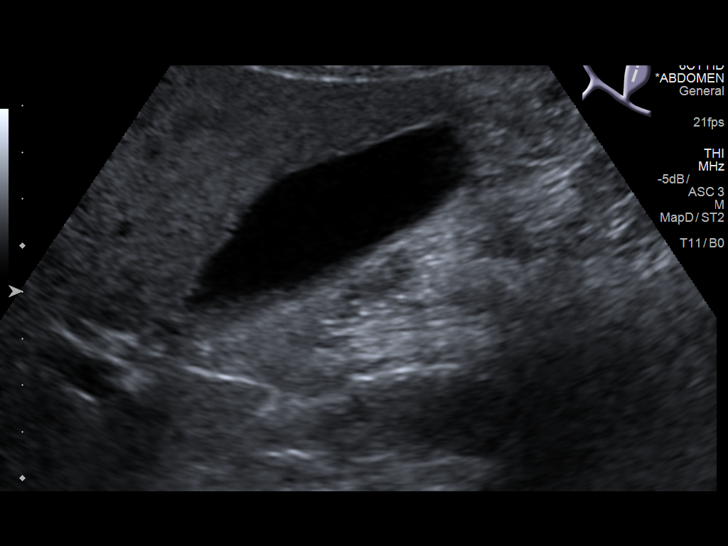
[im 7/73]
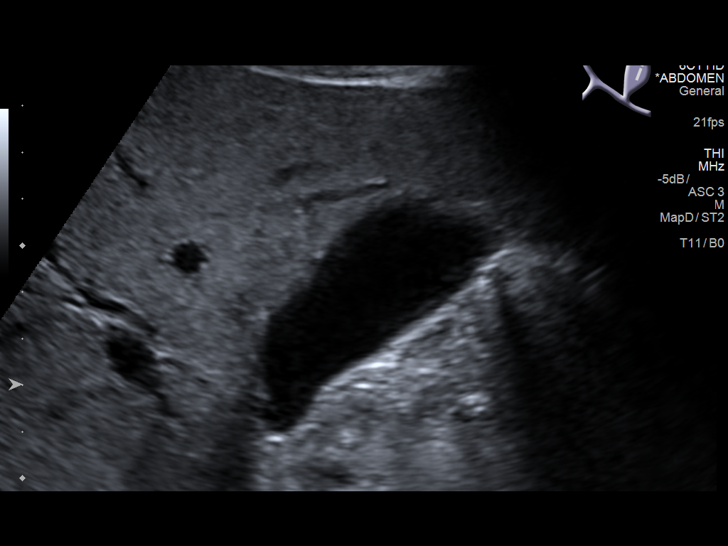
[im 13/73]
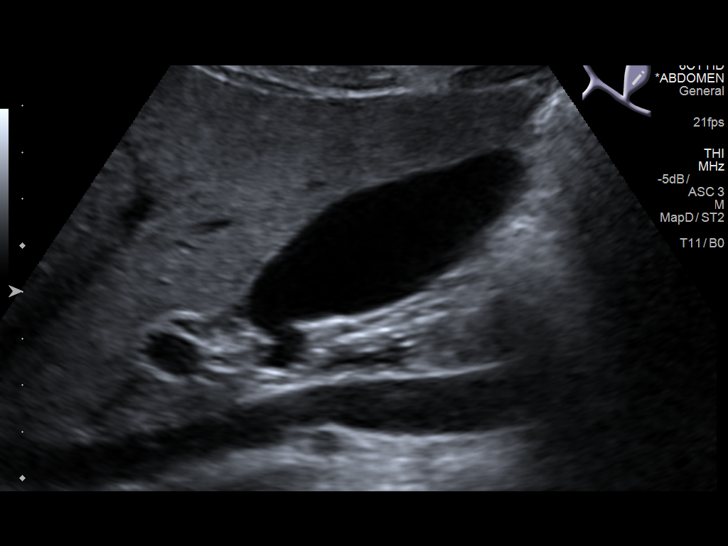
[im 19/73]
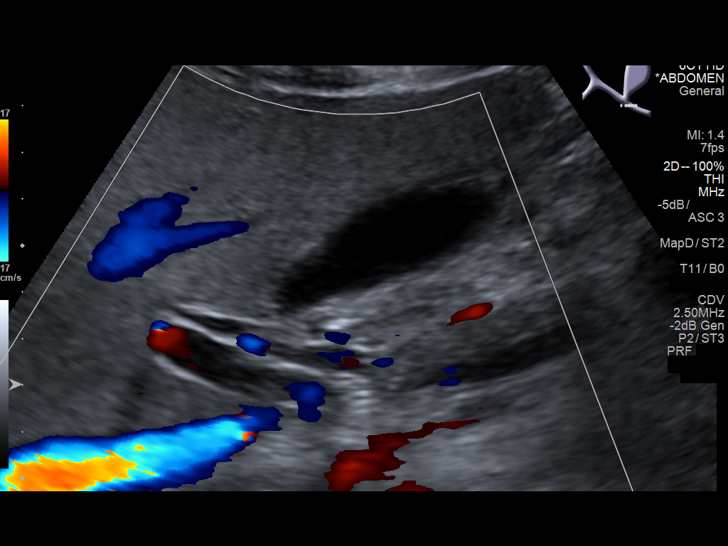
[im 25/73]
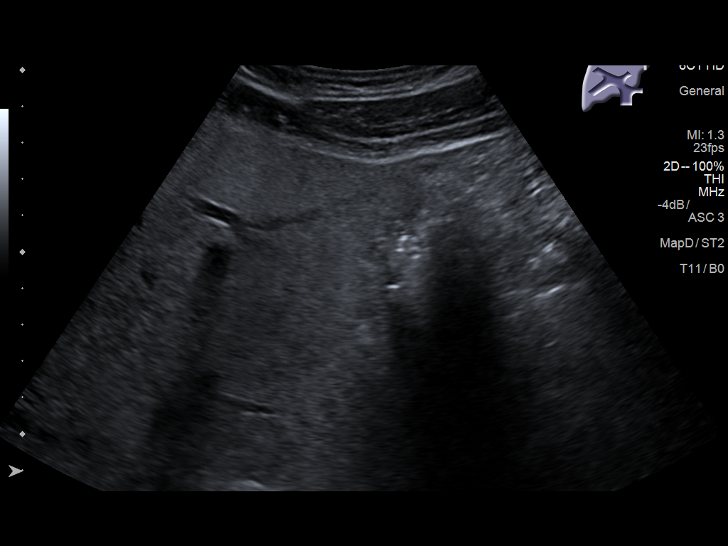
[im 28/73]
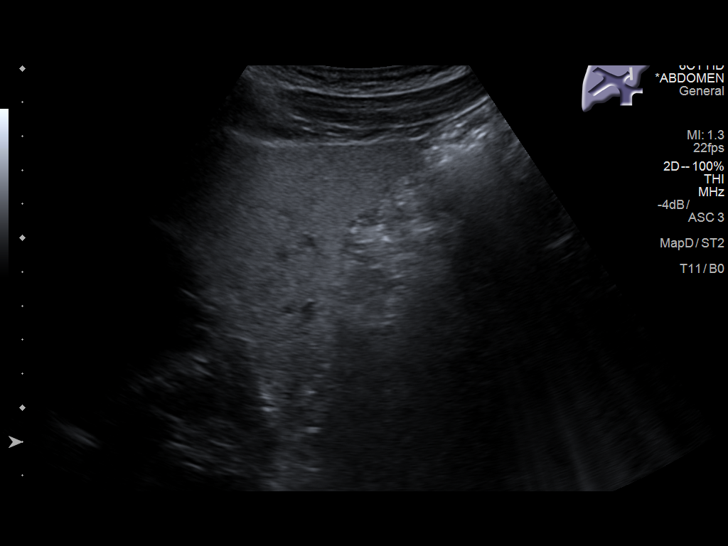
[im 34/73]
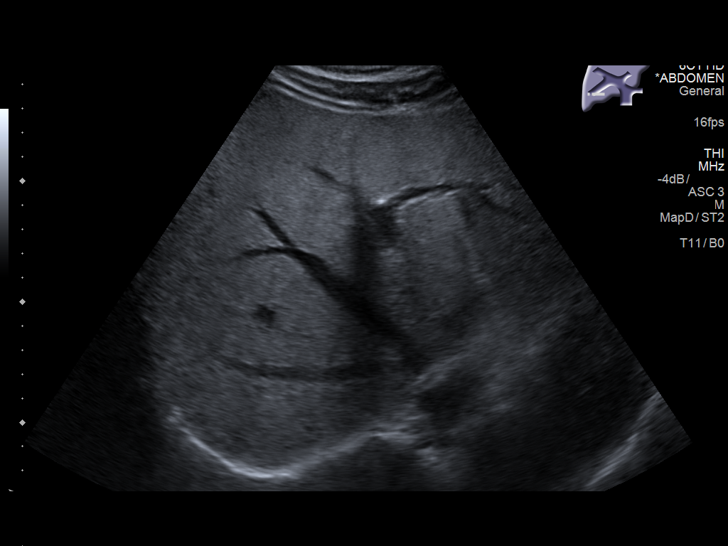
[im 40/73]
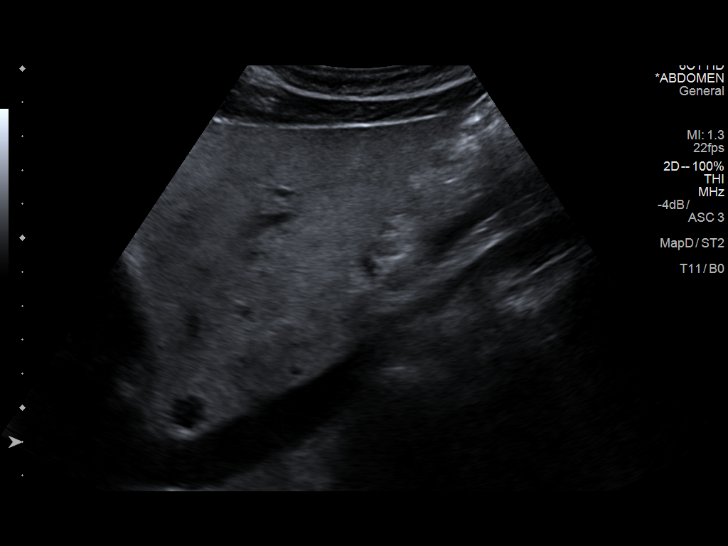
[im 46/73]
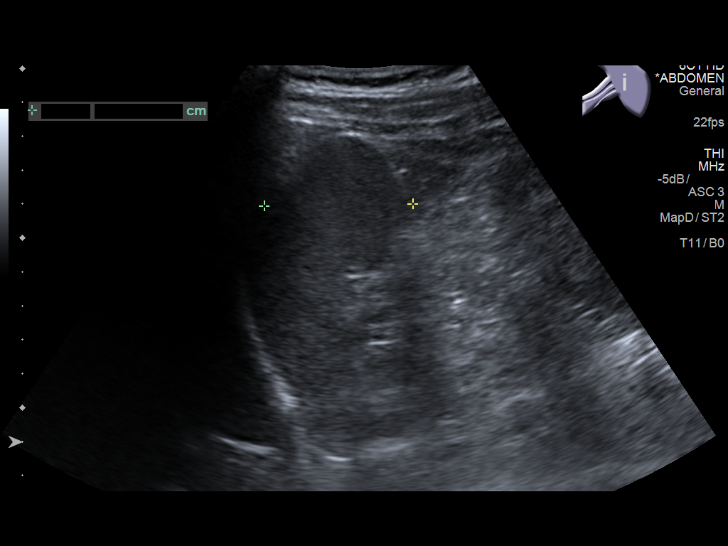
[im 49/73]
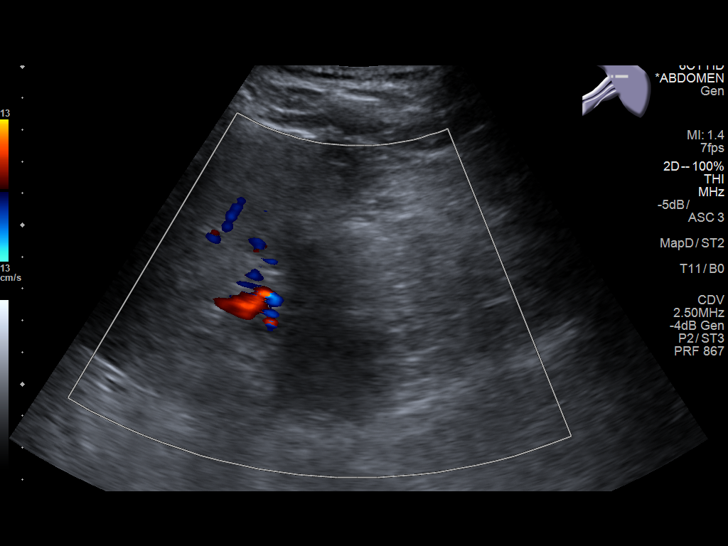
[im 55/73]
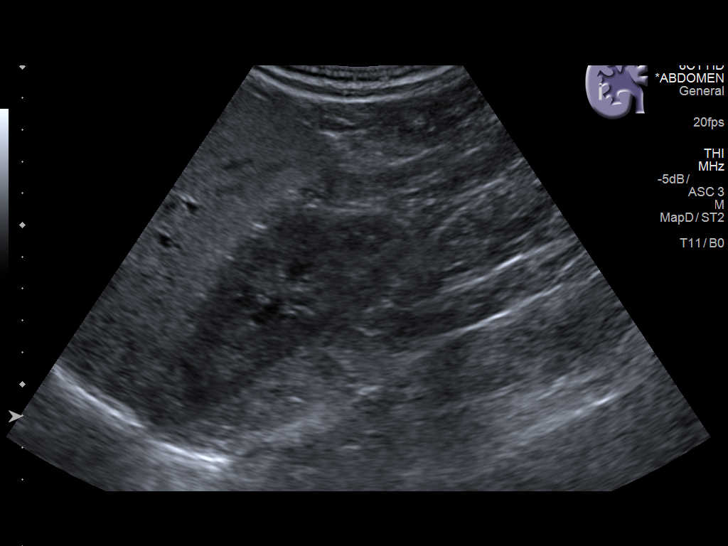
[im 61/73]
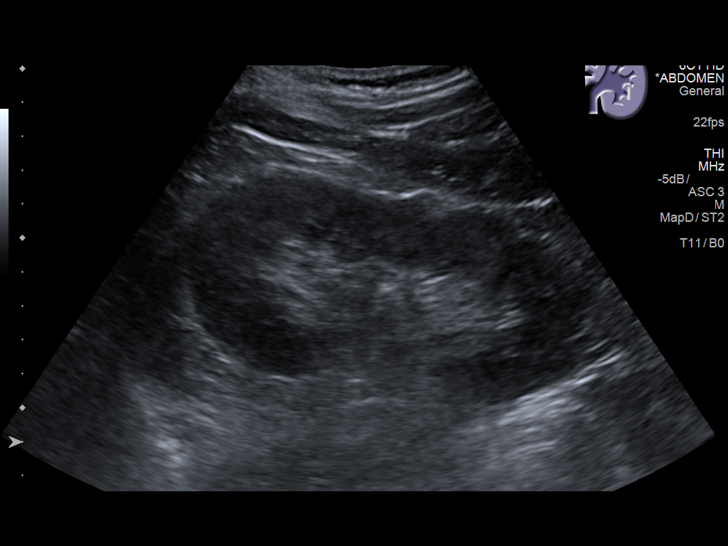
[im 67/73]
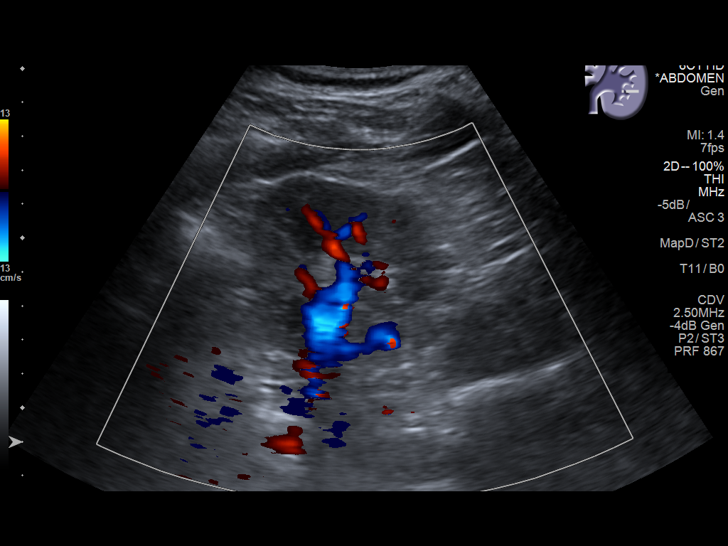
[im 73/73]
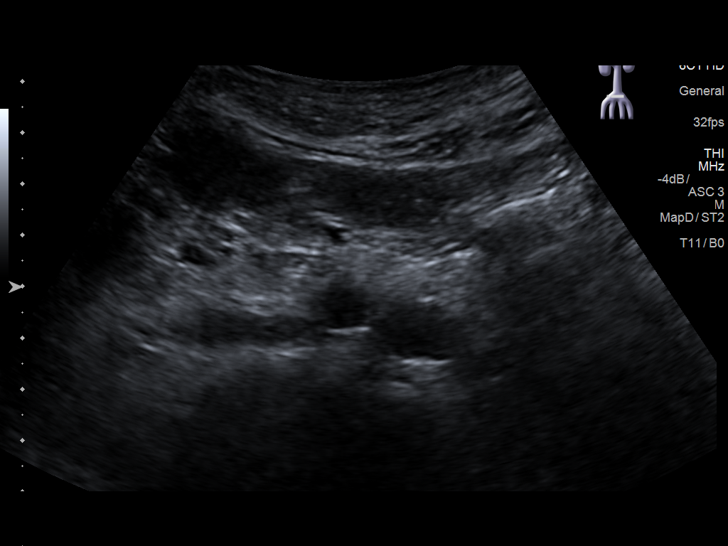

[14 of 25 positions shown; findings below may reference images not displayed]

FINDINGS: Gallbladder: No gallstones or wall thickening visualized. No
sonographic Murphy sign noted by sonographer.

Common bile duct: Diameter: 3.4 mm

Liver: No focal lesion identified. Diffusely increased echogenicity,
consistent with steatosis. Portal vein is patent on color Doppler
imaging with normal direction of blood flow towards the liver.

IVC: No abnormality visualized.

Pancreas: Visualized portion unremarkable.

Spleen: Size and appearance within normal limits.

Right Kidney: Length: 11.2 cm. Echogenicity within normal limits. No
mass or hydronephrosis visualized.

Left Kidney: Length: 12.3 cm. Echogenicity within normal limits. No
mass or hydronephrosis visualized.

Abdominal aorta: No aneurysm visualized.

Other findings: None.
IMPRESSION: 1. Hepatic steatosis.
2. Normal sonographic evaluation of the biliary tree. No
cholelithiasis, evidence for acute cholecystitis, or biliary
dilatation.

## 2020-03-29 ENCOUNTER — Other Ambulatory Visit: Payer: Self-pay

## 2020-03-30 ENCOUNTER — Ambulatory Visit (INDEPENDENT_AMBULATORY_CARE_PROVIDER_SITE_OTHER): Payer: 59 | Admitting: Family Medicine

## 2020-03-30 ENCOUNTER — Encounter: Payer: Self-pay | Admitting: Family Medicine

## 2020-03-30 VITALS — BP 106/74 | HR 80 | Temp 98.6°F | Ht 69.5 in | Wt 164.0 lb

## 2020-03-30 DIAGNOSIS — R7303 Prediabetes: Secondary | ICD-10-CM

## 2020-03-30 DIAGNOSIS — K760 Fatty (change of) liver, not elsewhere classified: Secondary | ICD-10-CM | POA: Diagnosis not present

## 2020-03-30 DIAGNOSIS — Z0000 Encounter for general adult medical examination without abnormal findings: Secondary | ICD-10-CM | POA: Diagnosis not present

## 2020-03-30 DIAGNOSIS — R748 Abnormal levels of other serum enzymes: Secondary | ICD-10-CM

## 2020-03-30 DIAGNOSIS — E785 Hyperlipidemia, unspecified: Secondary | ICD-10-CM | POA: Diagnosis not present

## 2020-03-30 DIAGNOSIS — Z130 Encounter for screening for diseases of the blood and blood-forming organs and certain disorders involving the immune mechanism: Secondary | ICD-10-CM | POA: Diagnosis not present

## 2020-03-30 DIAGNOSIS — Z800 Family history of malignant neoplasm of digestive organs: Secondary | ICD-10-CM

## 2020-03-30 LAB — CBC WITH DIFFERENTIAL/PLATELET
Basophils Absolute: 0 10*3/uL (ref 0.0–0.1)
Basophils Relative: 0.1 % (ref 0.0–3.0)
Eosinophils Absolute: 0.1 10*3/uL (ref 0.0–0.7)
Eosinophils Relative: 1.1 % (ref 0.0–5.0)
HCT: 46 % (ref 39.0–52.0)
Hemoglobin: 15.7 g/dL (ref 13.0–17.0)
Lymphocytes Relative: 33.3 % (ref 12.0–46.0)
Lymphs Abs: 2.2 10*3/uL (ref 0.7–4.0)
MCHC: 34.2 g/dL (ref 30.0–36.0)
MCV: 93.1 fl (ref 78.0–100.0)
Monocytes Absolute: 0.3 10*3/uL (ref 0.1–1.0)
Monocytes Relative: 5.3 % (ref 3.0–12.0)
Neutro Abs: 3.9 10*3/uL (ref 1.4–7.7)
Neutrophils Relative %: 60.2 % (ref 43.0–77.0)
Platelets: 217 10*3/uL (ref 150.0–400.0)
RBC: 4.94 Mil/uL (ref 4.22–5.81)
RDW: 12.3 % (ref 11.5–15.5)
WBC: 6.5 10*3/uL (ref 4.0–10.5)

## 2020-03-30 LAB — LIPID PANEL
Cholesterol: 213 mg/dL — ABNORMAL HIGH (ref 0–200)
HDL: 40.2 mg/dL (ref >39.00)
LDL Cholesterol: 133 mg/dL — ABNORMAL HIGH (ref 0–99)
NonHDL: 172.43
Total CHOL/HDL Ratio: 5
Triglycerides: 197 mg/dL — ABNORMAL HIGH (ref 0.0–149.0)
VLDL: 39.4 mg/dL (ref 0.0–40.0)

## 2020-03-30 LAB — COMPREHENSIVE METABOLIC PANEL
ALT: 65 U/L — ABNORMAL HIGH (ref 0–53)
AST: 41 U/L — ABNORMAL HIGH (ref 0–37)
Albumin: 4.8 g/dL (ref 3.5–5.2)
Alkaline Phosphatase: 61 U/L (ref 39–117)
BUN: 12 mg/dL (ref 6–23)
CO2: 30 mEq/L (ref 19–32)
Calcium: 9.6 mg/dL (ref 8.4–10.5)
Chloride: 101 mEq/L (ref 96–112)
Creatinine, Ser: 0.85 mg/dL (ref 0.40–1.50)
GFR: 108.99 mL/min (ref >60.00)
Glucose, Bld: 102 mg/dL — ABNORMAL HIGH (ref 70–99)
Potassium: 4.1 mEq/L (ref 3.5–5.1)
Sodium: 138 mEq/L (ref 135–145)
Total Bilirubin: 0.8 mg/dL (ref 0.2–1.2)
Total Protein: 7.5 g/dL (ref 6.0–8.3)

## 2020-03-30 LAB — HEMOGLOBIN A1C: Hgb A1c MFr Bld: 6.4 % (ref 4.6–6.5)

## 2020-03-30 LAB — TSH: TSH: 2.14 u[IU]/mL (ref 0.35–4.50)

## 2020-03-30 NOTE — Progress Notes (Signed)
This visit occurred during the SARS-CoV-2 public health emergency.  Safety protocols were in place, including screening questions prior to the visit, additional usage of staff PPE, and extensive cleaning of exam room while observing appropriate contact time as indicated for disinfecting solutions.    Patient ID: Gregory Knight, male  DOB: Oct 24, 1980, 39 y.o.   MRN: [id number] Patient Care Team    Relationship Specialty Notifications Start End  Natalia Leatherwood, DO PCP - General Family Medicine  02/01/16     Chief Complaint  Patient presents with  . Annual Exam    Pt is fasting    Subjective:  Gregory Knight is a 39 y.o. male present for CPE. All past medical history, surgical history, allergies, family history, immunizations, medications and social history were updated in the electronic medical record today. All recent labs, ED visits and hospitalizations within the last year were reviewed.  Abnormal LFT/ hyperlipidemia/FH bile duct cancer/ abnormal [place]: He stopped his fenofibrate and he does not recall why. He is taking his fish oil supplement.   Prediabetes: 5.9>5.8 last time.   Health maintenance:  Colonoscopy: No FHX colon cancer, bile duct cancer present in > 2 family members at age 89.  Immunizations:  tdap UTD 2015, influenza UTD 2021, covid series completed Infectious disease screening: HIV completed , hep c completed PSA: No results found for: PSA, pt was counseled on prostate cancer screenings.Routine screen at 49  Assistive device: Oxygen use: none Patient has a Dental home. Hospitalizations/ED visits: reviewed  Depression screen [facility] 2/9 11/10/2017 02/27/2016 02/01/2016  Decreased Interest 0 0 0  Down, Depressed, Hopeless 0 0 0  PHQ - 2 Score 0 0 0   No flowsheet data found.     Fall Risk  11/10/2017 02/27/2016 02/01/2016  Falls in the past year? No No No   Immunization History  Administered Date(s) Administered  . Influenza,inj,Quad PF,6+ Mos 02/27/2016, 07/24/2017   . Influenza-Unspecified 01/19/2014, 05/04/2018, 02/20/2020  . Moderna SARS-COVID-2 Vaccination 07/12/2019, 08/09/2019  . PFIZER SARS-COV-2 Vaccination 03/05/2020     Past Medical History:  Diagnosis Date  . Bronchospasm   . Medical history non-contributory    No Known Allergies Past Surgical History:  Procedure Laterality Date  . NO PAST SURGERIES     Family History  Problem Relation Age of Onset  . Cancer Father 12       Bile duct   . Stroke Paternal Aunt   . Cancer Paternal Uncle 70       Bile duct    Social History   Social History Narrative   Married to can Wiley Ford, 2 children [place] and [place].    PhD, works as an Dance movement psychotherapist.    Wears his seatbelt, bicycle helmet.    Exercises routinely.    Smoke detector in the home.   Feels safe in the relationship.         Allergies as of 03/30/2020   No Known Allergies     Medication List       Accurate as of March 30, 2020  8:43 AM. If you have any questions, ask your nurse or doctor.        dorzolamide-timolol 22.3-6.8 MG/ML ophthalmic solution Commonly known as: COSOPT SMARTSIG:In Eye(s)   fenofibrate 145 MG tablet Commonly known as: TRICOR Take 1 tablet (145 mg total) by mouth daily.   latanoprost 0.005 % ophthalmic solution Commonly known as: XALATAN SMARTSIG:In Eye(s)      All past medical history, surgical history, allergies, family history,  immunizations andmedications were updated in the EMR today and reviewed under the history and medication portions of their EMR.     No results found for this or any previous visit (from the past 2160 hour(s)).  US Abdomen Complete  Result Date: 11/12/2017 . IMPRESSION: 1. Hepatic steatosis. 2. Normal sonographic evaluation of the biliary tree. No cholelithiasis, evidence for acute cholecystitis, or biliary dilatation. Electronically Signed   By: Rise Mu M.D.   On: 11/12/2017 23:53   ROS: 14 pt review of systems performed and negative (unless mentioned  in an HPI)  Objective: BP 106/74   Pulse 80   Temp 98.6 F (37 C) (Oral)   Ht 5' 9.5" (1.765 m)   Wt 164 lb (74.4 kg)   SpO2 97%   BMI 23.87 kg/m  Gen: Afebrile. No acute distress. Nontoxic in appearance, well-developed, well-nourished,  Pleasant male.  HENT: AT. [place]. Bilateral TM visualized and normal in appearance, normal external auditory canal. MMM, no oral lesions, adequate dentition. Bilateral nares within normal limits. Throat without erythema, ulcerations or exudates. no Cough on exam, no hoarseness on exam. Eyes:Pupils Equal Round Reactive to light, Extraocular movements intact,  Conjunctiva without redness, discharge or icterus. Neck/lymp/endocrine: Supple,no lymphadenopathy, no thyromegaly CV: RRR no murmur, no edema, +2/4 P posterior tibialis pulses.  Chest: CTAB, no wheeze, rhonchi or crackles. normal Respiratory effort. good Air movement. Abd: Soft. flat. NTND. BS present. no Masses palpated. No hepatosplenomegaly. No rebound tenderness or guarding. Skin: no rashes, purpura or petechiae. Warm and well-perfused. Skin intact. Neuro/Msk:  Normal gait. PERLA. EOMi. Alert. Oriented x3.  Cranial nerves II through XII intact. Muscle strength 5/5 upper/lower extremity. DTRs equal bilaterally. Psych: Normal affect, dress and demeanor. Normal speech. Normal thought content and judgment.  No exam data present  Assessment/plan: Gregory Knight is a 39 y.o. male present for CPE Prediabetes - last a1c 5.9>5.8 - discussed low carb/sugar diet. Healthy carbs.  - increase exercise to > 150 minutes a week - a1c collected today  Hyperlipidemia LDL goal <100/Hepatic steatosis Continue diet and exercise.  - continue fish oil- may need to restart fenofibrate if tg high again. He is ok with taking med if needed.  - Comprehensive metabolic panel - Lipid panel - TSH  Prediabetes - Hemoglobin A1c Elevated liver enzymes/ Family history of malignant neoplasm of bile ducts - Comprehensive  metabolic panel - avoidance of liver toxic agents, he does not drink any alcohol.  - hep panel, AFP, cmp> normal last visit 2 yrs ago - repeat ABD [place]  2019 Bile duct normal.  Screening for deficiency anemia - CBC with Differential/Platelet  Routine general medical examination at a health care facilityPatient was encouraged to exercise greater than 150 minutes a week. Patient was encouraged to choose a diet filled with fresh fruits and vegetables, and lean meats. AVS provided to patient today for education/recommendation on gender specific health and safety maintenance. Colonoscopy: No FHX colon cancer, bile duct cancer present in > 2 family members at age 53.  Immunizations:  tdap UTD 2015, influenza UTD 2021, covid series completed Infectious disease screening: HIV completed , hep c completed  Return in about 1 year (around 03/30/2021) for CPE (30 min).  Orders Placed This Encounter  Procedures  . CBC with Differential/Platelet  . Comprehensive metabolic panel  . Hemoglobin A1c  . Lipid panel  . TSH   No orders of the defined types were placed in this encounter.  Referral Orders  No referral(s) requested today  Note is dictated utilizing voice recognition software. Although note has been proof read prior to signing, occasional typographical errors still can be missed. If any questions arise, please do not hesitate to call for verification.  Electronically signed by: Howard Pouch, DO [place]

## 2020-03-30 NOTE — Patient Instructions (Signed)

## 2020-03-31 ENCOUNTER — Telehealth: Payer: Self-pay | Admitting: Family Medicine

## 2020-03-31 NOTE — Telephone Encounter (Signed)
LVM for pt to CB regarding results.  

## 2020-03-31 NOTE — Telephone Encounter (Signed)
Please call patient - kidney and thyroid function are normal - Blood cell counts and electrolytes are normal - Diabetes screening is significantly elevated this time and he is in the prediabetic range with a level of 6.4.  A level of 6.5 or higher is considered diabetic.   -Recommend increasing exercise and decreasing sugar/carbohydrate loaded meals.  No medication is needed at this time however we will need to do a closer follow-up to ensure he is not progressing to diabetes. - Cholesterol panel overall looks okay.  His triglycerides are only mildly elevated at 197, normal is below 150.  I would encourage him to continue his fish oil supplement and increase the fiber in his diet with a daily dose of Metamucil.  No prescribed medications needed at this time. -Lastly his liver enzymes are mildly elevated again, consistent where they were about 4 years ago.  This is likely from his diagnosis of hepatic steatosis/fatty liver and his increase in A1c on his labs.  Increasing exercise decreasing sugars, carbohydrates in avoiding any alcohol use will help protect his liver.  Follow-up 5-6 months with provider for prediabetes monitoring and recheck on liver enzymes.

## 2020-04-02 NOTE — Telephone Encounter (Signed)
Spoke with pt regarding labs and instructions.   

## 2020-07-30 ENCOUNTER — Telehealth: Payer: Self-pay

## 2020-07-30 NOTE — Telephone Encounter (Signed)
Patient had physical 03/30/20. His employer is requesting something on our letterhead that he had a physical and in overall good health.  Patient is aware Dr. Claiborne Billings not in office today, 3-5 business days for completeion. Please email document to email on file. [email or web address]  Please call patient with any questions [phone]

## 2020-07-31 NOTE — Telephone Encounter (Signed)
We can provide him with a letter stating he had his preventative physical exam on 03/30/2020 with preventative labs.  "Overall good health" is a subjective finding with no criteria of definition.  If they require more specific information, they would need to fax over a form for [place] to complete, with his permission-Since he does have elevated triglycerides and an elevated A1c in the prediabetic range.   Thanks.

## 2020-08-01 NOTE — Telephone Encounter (Signed)
Patient called and was asking about letter he needed He states that he does not need to have a form filled out, he is needing something that states he is in good health?  He says that there is no form the employer can send over to [place] to have filled out by PCP  Please advise

## 2020-08-02 ENCOUNTER — Encounter: Payer: Self-pay | Admitting: Family Medicine

## 2020-08-02 NOTE — Telephone Encounter (Signed)
Letter printed for providers review

## 2020-08-02 NOTE — Telephone Encounter (Signed)
Letter sent via my chart

## 2020-08-02 NOTE — Telephone Encounter (Signed)
Spoke with pt and informed him that we can't write a letter stating he is in "good health" due to not being medical terminology and definition being vague to each individual. Pt is to upload the letter or statement from his employer on mychart so I can better assist.

## 2020-09-14 ENCOUNTER — Ambulatory Visit: Payer: 59 | Admitting: Family Medicine

## 2020-09-20 ENCOUNTER — Ambulatory Visit: Payer: 59 | Admitting: Family Medicine

## 2021-04-02 ENCOUNTER — Other Ambulatory Visit: Payer: Self-pay

## 2021-04-03 ENCOUNTER — Encounter: Payer: Self-pay | Admitting: Family Medicine

## 2021-04-03 ENCOUNTER — Ambulatory Visit (INDEPENDENT_AMBULATORY_CARE_PROVIDER_SITE_OTHER): Payer: 59 | Admitting: Family Medicine

## 2021-04-03 VITALS — BP 92/59 | HR 73 | Temp 98.3°F | Ht 69.5 in | Wt 166.0 lb

## 2021-04-03 DIAGNOSIS — R748 Abnormal levels of other serum enzymes: Secondary | ICD-10-CM | POA: Diagnosis not present

## 2021-04-03 DIAGNOSIS — K760 Fatty (change of) liver, not elsewhere classified: Secondary | ICD-10-CM | POA: Diagnosis not present

## 2021-04-03 DIAGNOSIS — R7303 Prediabetes: Secondary | ICD-10-CM | POA: Diagnosis not present

## 2021-04-03 DIAGNOSIS — Z800 Family history of malignant neoplasm of digestive organs: Secondary | ICD-10-CM

## 2021-04-03 DIAGNOSIS — E785 Hyperlipidemia, unspecified: Secondary | ICD-10-CM | POA: Diagnosis not present

## 2021-04-03 DIAGNOSIS — Z0000 Encounter for general adult medical examination without abnormal findings: Secondary | ICD-10-CM

## 2021-04-03 LAB — LIPID PANEL
Cholesterol: 212 mg/dL — ABNORMAL HIGH (ref 0–200)
HDL: 43 mg/dL (ref >39.00)
NonHDL: 168.87
Total CHOL/HDL Ratio: 5
Triglycerides: 266 mg/dL — ABNORMAL HIGH (ref 0.0–149.0)
VLDL: 53.2 mg/dL — ABNORMAL HIGH (ref 0.0–40.0)

## 2021-04-03 LAB — COMPREHENSIVE METABOLIC PANEL
ALT: 50 U/L (ref 0–53)
AST: 31 U/L (ref 0–37)
Albumin: 4.8 g/dL (ref 3.5–5.2)
Alkaline Phosphatase: 62 U/L (ref 39–117)
BUN: 12 mg/dL (ref 6–23)
CO2: 30 mEq/L (ref 19–32)
Calcium: 10.1 mg/dL (ref 8.4–10.5)
Chloride: 104 mEq/L (ref 96–112)
Creatinine, Ser: 0.85 mg/dL (ref 0.40–1.50)
GFR: 108.22 mL/min (ref >60.00)
Glucose, Bld: 95 mg/dL (ref 70–99)
Potassium: 4.2 mEq/L (ref 3.5–5.1)
Sodium: 141 mEq/L (ref 135–145)
Total Bilirubin: 0.9 mg/dL (ref 0.2–1.2)
Total Protein: 7.5 g/dL (ref 6.0–8.3)

## 2021-04-03 LAB — HEMOGLOBIN A1C: Hgb A1c MFr Bld: 6.2 % (ref 4.6–6.5)

## 2021-04-03 LAB — CBC
HCT: 46.4 % (ref 39.0–52.0)
Hemoglobin: 15.7 g/dL (ref 13.0–17.0)
MCHC: 33.7 g/dL (ref 30.0–36.0)
MCV: 94.9 fl (ref 78.0–100.0)
Platelets: 206 10*3/uL (ref 150.0–400.0)
RBC: 4.89 Mil/uL (ref 4.22–5.81)
RDW: 12.3 % (ref 11.5–15.5)
WBC: 6.6 10*3/uL (ref 4.0–10.5)

## 2021-04-03 LAB — TSH: TSH: 1.89 u[IU]/mL (ref 0.35–5.50)

## 2021-04-03 LAB — LDL CHOLESTEROL, DIRECT: Direct LDL: 134 mg/dL

## 2021-04-03 NOTE — Patient Instructions (Signed)
 Great to see you today.  I have refilled the medication(s) we provide.   If labs were collected, we will inform you of lab results once received either by echart message or telephone call.   - echart message- for normal results that have been seen by the patient already.   - telephone call: abnormal results or if patient has not viewed results in their echart.   Health Maintenance, Male Adopting a healthy lifestyle and getting preventive care are important in promoting health and wellness. Ask your health care provider about: The right schedule for you to have regular tests and exams. Things you can do on your own to prevent diseases and keep yourself healthy. What should I know about diet, weight, and exercise? Eat a healthy diet  Eat a diet that includes plenty of vegetables, fruits, low-fat dairy products, and lean protein. Do not eat a lot of foods that are high in solid fats, added sugars, or sodium. Maintain a healthy weight Body mass index (BMI) is a measurement that can be used to identify possible weight problems. It estimates body fat based on height and weight. Your health care provider can help determine your BMI and help you achieve or maintain a healthy weight. Get regular exercise Get regular exercise. This is one of the most important things you can do for your health. Most adults should: Exercise for at least 150 minutes each week. The exercise should increase your heart rate and make you sweat (moderate-intensity exercise). Do strengthening exercises at least twice a week. This is in addition to the moderate-intensity exercise. Spend less time sitting. Even light physical activity can be beneficial. Watch cholesterol and blood lipids Have your blood tested for lipids and cholesterol at 41 years of age, then have this test every 5 years. You may need to have your cholesterol levels checked more often if: Your lipid or cholesterol levels are high. You are older than 40  years of age. You are at high risk for heart disease. What should I know about cancer screening? Many types of cancers can be detected early and may often be prevented. Depending on your health history and family history, you may need to have cancer screening at various ages. This may include screening for: Colorectal cancer. Prostate cancer. Skin cancer. Lung cancer. What should I know about heart disease, diabetes, and high blood pressure? Blood pressure and heart disease High blood pressure causes heart disease and increases the risk of stroke. This is more likely to develop in people who have high blood pressure readings or are overweight. Talk with your health care provider about your target blood pressure readings. Have your blood pressure checked: Every 3-5 years if you are 18-39 years of age. Every year if you are 40 years old or older. If you are between the ages of 65 and 75 and are a current or former smoker, ask your health care provider if you should have a one-time screening for abdominal aortic aneurysm (AAA). Diabetes Have regular diabetes screenings. This checks your fasting blood sugar level. Have the screening done: Once every three years after age 45 if you are at a normal weight and have a low risk for diabetes. More often and at a younger age if you are overweight or have a high risk for diabetes. What should I know about preventing infection? Hepatitis B If you have a higher risk for hepatitis B, you should be screened for this virus. Talk with your health care provider to   find out if you are at risk for hepatitis B infection. Hepatitis C Blood testing is recommended for: Everyone born from 1945 through 1965. Anyone with known risk factors for hepatitis C. Sexually transmitted infections (STIs) You should be screened each year for STIs, including gonorrhea and chlamydia, if: You are sexually active and are younger than 41 years of age. You are older than 41 years  of age and your health care provider tells you that you are at risk for this type of infection. Your sexual activity has changed since you were last screened, and you are at increased risk for chlamydia or gonorrhea. Ask your health care provider if you are at risk. Ask your health care provider about whether you are at high risk for HIV. Your health care provider may recommend a prescription medicine to help prevent HIV infection. If you choose to take medicine to prevent HIV, you should first get tested for HIV. You should then be tested every 3 months for as long as you are taking the medicine. Follow these instructions at home: Alcohol use Do not drink alcohol if your health care provider tells you not to drink. If you drink alcohol: Limit how much you have to 0-2 drinks a day. Know how much alcohol is in your drink. In the [place] one drink equals one 12 oz bottle of beer (355 mL), one 5 oz glass of wine (148 mL), or one 1 oz glass of hard liquor (44 mL). Lifestyle Do not use any products that contain nicotine or tobacco. These products include cigarettes, chewing tobacco, and vaping devices, such as e-cigarettes. If you need help quitting, ask your health care provider. Do not use street drugs. Do not share needles. Ask your health care provider for help if you need support or information about quitting drugs. General instructions Schedule regular health, dental, and eye exams. Stay current with your vaccines. Tell your health care provider if: You often feel depressed. You have ever been abused or do not feel safe at home. Summary Adopting a healthy lifestyle and getting preventive care are important in promoting health and wellness. Follow your health care provider's instructions about healthy diet, exercising, and getting tested or screened for diseases. Follow your health care provider's instructions on monitoring your cholesterol and blood pressure. This information is not intended  to replace advice given to you by your health care provider. Make sure you discuss any questions you have with your health care provider. Document Revised: 08/27/2020 Document Reviewed: 08/27/2020 Elsevier Patient Education  2022 [employer]  

## 2021-04-03 NOTE — Progress Notes (Signed)
This visit occurred during the SARS-CoV-2 public health emergency.  Safety protocols were in place, including screening questions prior to the visit, additional usage of staff PPE, and extensive cleaning of exam room while observing appropriate contact time as indicated for disinfecting solutions.    Patient ID: Gregory Knight, male  DOB: 03/22/1981, 40 y.o.   MRN: [id number] Patient Care Team    Relationship Specialty Notifications Start End  Natalia Leatherwood, DO PCP - General Family Medicine  02/01/16     Chief Complaint  Patient presents with   Annual Exam    Pt is not fasting    Subjective:  Gregory Knight is a 40 y.o. male present for CPE. All past medical history, surgical history, allergies, family history, immunizations, medications and social history were updated in the electronic medical record today. All recent labs, ED visits and hospitalizations within the last year were reviewed.   Abnormal LFT/ hyperlipidemia/FH bile duct cancer/ abnormal [place]:  He stopped his fenofibrate. He is taking his fish oil supplement.    Prediabetes: 5.9>5.8 > 6.4 last time. Tried to cut back on sugar.    Health maintenance:  Colonoscopy: No FHX colon cancer, bile duct cancer present in > 2 family members at age 31.  Immunizations:  tdap UTD 2015, influenza UTD 2022, covid series completed Infectious disease screening: HIV completed , hep c completed PSA: No results found for: PSA, pt was counseled on prostate cancer screenings. No fhx.  Assistive device: none Oxygen [facility] Patient has a Dental home. Hospitalizations/ED visits: reviewed  Depression screen [facility] 2/9 04/03/2021 11/10/2017 02/27/2016 02/01/2016  Decreased Interest 0 0 0 0  Down, Depressed, Hopeless 0 0 0 0  PHQ - 2 Score 0 0 0 0   No flowsheet data found.       Fall Risk  11/10/2017 02/27/2016 02/01/2016  Falls in the past year? No No No      Immunization History  Administered Date(s) Administered   Influenza,inj,Quad  PF,6+ Mos 02/27/2016, 07/24/2017   Influenza-Unspecified 01/19/2014, 05/04/2018, 02/20/2020, 01/19/2021   Moderna Sars-Covid-2 Vaccination 07/12/2019, 08/09/2019   PFIZER(Purple Top)SARS-COV-2 Vaccination 03/05/2020   Pfizer Covid-19 Vaccine Bivalent Booster 9yrs & up 02/11/2021   Tdap 04/21/2013     Past Medical History:  Diagnosis Date   Bronchospasm    Medical history non-contributory    No Known Allergies Past Surgical History:  Procedure Laterality Date   CYST REMOVAL TRUNK  2014   back ? epidermal cyst   Family History  Problem Relation Age of Onset   Cancer Father 60       Bile duct    Stroke Paternal Aunt    Cancer Paternal Uncle 24       Bile duct    Social History   Social History Narrative   Married to can [place], 2 children Gregory Knight and Gregory Knight.    PhD, works as an Dance movement psychotherapist.    Wears his seatbelt, bicycle helmet.    Exercises routinely.    Smoke detector in the home.   Feels safe in the relationship.         Allergies as of 04/03/2021   No Known Allergies      Medication List        Accurate as of April 03, 2021  8:31 AM. If you have any questions, ask your nurse or doctor.          STOP taking these medications    fenofibrate 145 MG tablet Commonly known as: TRICOR Stopped  by: Felix Pacini, DO   fexofenadine 60 MG tablet Commonly known as: ALLEGRA Stopped by: Felix Pacini, DO       TAKE these medications    dorzolamide-timolol 22.3-6.8 MG/ML ophthalmic solution Commonly known as: COSOPT SMARTSIG:In Eye(s)   FISH OIL PO Take by mouth.   latanoprost 0.005 % ophthalmic solution Commonly known as: XALATAN SMARTSIG:In Eye(s)       All past medical history, surgical history, allergies, family history, immunizations andmedications were updated in the EMR today and reviewed under the history and medication portions of their EMR.     No results found for this or any previous visit (from the past 2160 hour(s)).  US Abdomen  Complete  Result Date: 11/12/2017 CLINICAL DATA:  Initial evaluation for elevated LFTs. Family history of biliary carcinoma. EXAM: ABDOMEN ULTRASOUND COMPLETE COMPARISON:  Prior ultrasound from 03/02/2016. FINDINGS: Gallbladder: No gallstones or wall thickening visualized. No sonographic Murphy sign noted by sonographer. Common bile duct: Diameter: 3.4 mm Liver: No focal lesion identified. Diffusely increased echogenicity, consistent with steatosis. Portal vein is patent on color Doppler imaging with normal direction of blood flow towards the liver. IVC: No abnormality visualized. Pancreas: Visualized portion unremarkable. Spleen: Size and appearance within normal limits. Right Kidney: Length: 11.2 cm. Echogenicity within normal limits. No mass or hydronephrosis visualized. Left Kidney: Length: 12.3 cm. Echogenicity within normal limits. No mass or hydronephrosis visualized. Abdominal aorta: No aneurysm visualized. Other findings: None. IMPRESSION: 1. Hepatic steatosis. 2. Normal sonographic evaluation of the biliary tree. No cholelithiasis, evidence for acute cholecystitis, or biliary dilatation. Electronically Signed   By: Rise Mu M.D.   On: 11/12/2017 23:53    ROS 14 pt review of systems performed and negative (unless mentioned in an HPI)  Objective:  BP (!) 92/59    Pulse 73    Temp 98.3 F (36.8 C) (Oral)    Ht 5' 9.5" (1.765 m)    Wt 166 lb (75.3 kg)    SpO2 98%    BMI 24.16 kg/m   Physical Exam Constitutional:      General: He is not in acute distress.    Appearance: Normal appearance. He is not ill-appearing, toxic-appearing or diaphoretic.  HENT:     Head: Normocephalic and atraumatic.     Right Ear: Tympanic membrane, ear canal and external ear normal. There is no impacted cerumen.     Left Ear: Tympanic membrane, ear canal and external ear normal. There is no impacted cerumen.     Nose: Nose normal. No congestion or rhinorrhea.     Mouth/Throat:     Mouth: Mucous  membranes are moist.     Pharynx: Oropharynx is clear. No oropharyngeal exudate or posterior oropharyngeal erythema.  Eyes:     General: No scleral icterus.       Right eye: No discharge.        Left eye: No discharge.     Extraocular Movements: Extraocular movements intact.     Pupils: Pupils are equal, round, and reactive to light.  Cardiovascular:     Rate and Rhythm: Normal rate and regular rhythm.     Pulses: Normal pulses.     Heart sounds: Normal heart sounds. No murmur heard.   No friction rub. No gallop.  Pulmonary:     Effort: Pulmonary effort is normal. No respiratory distress.     Breath sounds: Normal breath sounds. No stridor. No wheezing, rhonchi or rales.  Chest:     Chest wall: No tenderness.  Abdominal:  General: Abdomen is flat. Bowel sounds are normal. There is no distension.     Palpations: Abdomen is soft. There is no mass.     Tenderness: There is no abdominal tenderness. There is no right CVA tenderness, left CVA tenderness, guarding or rebound.     Hernia: No hernia is present.  Musculoskeletal:        General: No swelling or tenderness. Normal range of motion.     Cervical back: Normal range of motion and neck supple.     Right lower leg: No edema.     Left lower leg: No edema.  Lymphadenopathy:     Cervical: No cervical adenopathy.  Skin:    General: Skin is warm and dry.     Coloration: Skin is not jaundiced.     Findings: No bruising, lesion or rash.  Neurological:     General: No focal deficit present.     Mental Status: He is alert and oriented to person, place, and time. Mental status is at baseline.     Cranial Nerves: No cranial nerve deficit.     Sensory: No sensory deficit.     Motor: No weakness.     Coordination: Coordination normal.     Gait: Gait normal.     Deep Tendon Reflexes: Reflexes normal.  Psychiatric:        Mood and Affect: Mood normal.        Behavior: Behavior normal.        Thought Content: Thought content normal.         Judgment: Judgment normal.    No results found.  Assessment/plan: Treysean Petruzzi is a 40 y.o. male present for CPE Prediabetes - last a1c 5.9>5.8> 6.4 and FBG 102 - discussed low carb/sugar diet. Healthy carbs.  - increase exercise to > 150 minutes a week - a1c collected today   Hyperlipidemia LDL goal <100/Hepatic steatosis Continue diet and exercise.  - continue fish oil- may need to restart fenofibrate if tg high again. He is ok with taking med if needed.  Cbc, Cmp, tsh, lipid> not fasting.    Elevated liver enzymes/ Family history of malignant neoplasm of bile ducts - CMP - avoidance of liver toxic agents, he does not drink any alcohol.  - hep panel, AFP, cmp> normal last visit 2 yrs ago - repeat ABD [place] ordered-  2019 Bile duct normal.   Routine general medical examination at a health care facility Colonoscopy: No FHX colon cancer, bile duct cancer present in > 2 family members at age 56.  Immunizations:  tdap UTD 2015, influenza UTD 2022, covid series completed Infectious disease screening: HIV completed , hep c completed Patient was encouraged to exercise greater than 150 minutes a week. Patient was encouraged to choose a diet filled with fresh fruits and vegetables, and lean meats. AVS provided to patient today for education/recommendation on gender specific health and safety maintenance.  Return in about 1 year (around 04/04/2022) for CPE (30 min).   Orders Placed This Encounter  Procedures   US Abdomen Complete    Standing Status:   Future    Standing Expiration Date:   04/03/2022    Order Specific Question:   Reason for Exam (SYMPTOM  OR DIAGNOSIS REQUIRED)    Answer:   elevated lft, fatty liver changes, fhx bile duct cancer (multiple members)    Order Specific Question:   Preferred imaging location?    Answer:   [facility]   CBC   Comprehensive  metabolic panel   Hemoglobin A1c   Lipid panel   TSH    No orders of the defined types were placed in  this encounter.  Referral Orders  No referral(s) requested today     Note is dictated utilizing voice recognition software. Although note has been proof read prior to signing, occasional typographical errors still can be missed. If any questions arise, please do not hesitate to call for verification.  Electronically signed by: Felix Pacini, DO [facility] [place]

## 2021-04-05 ENCOUNTER — Telehealth: Payer: Self-pay | Admitting: Family Medicine

## 2021-04-05 DIAGNOSIS — Z5329 Procedure and treatment not carried out because of patient's decision for other reasons: Secondary | ICD-10-CM | POA: Insufficient documentation

## 2021-04-05 DIAGNOSIS — K760 Fatty (change of) liver, not elsewhere classified: Secondary | ICD-10-CM

## 2021-04-05 DIAGNOSIS — Z800 Family history of malignant neoplasm of digestive organs: Secondary | ICD-10-CM

## 2021-04-05 DIAGNOSIS — R748 Abnormal levels of other serum enzymes: Secondary | ICD-10-CM

## 2021-04-05 NOTE — Telephone Encounter (Signed)
Pt refused ABD [place] upon attempt to schedule. Completed for LFT elevation, hepatic steatosis with strong fhx of cholangiocarcinoma.

## 2021-04-05 NOTE — Telephone Encounter (Signed)
-----   Message from Gregory Knight sent at 04/05/2021 11:12 AM EST ----- Regarding: patient refused ultrasound Just wanted to let you know that patient refused to have the ultrasound that you ordered for him.  If you need anything else, please let me know.  Thanks Lawson Fiscal

## 2021-05-20 ENCOUNTER — Encounter: Payer: Self-pay | Admitting: Family Medicine

## 2021-05-20 NOTE — Telephone Encounter (Signed)
printed

## 2021-05-21 NOTE — Telephone Encounter (Signed)
Formed signed and returned to [employer]

## 2021-05-21 NOTE — Telephone Encounter (Signed)
Form completed and placing on PCP desk.

## 2021-06-04 ENCOUNTER — Other Ambulatory Visit: Payer: Self-pay

## 2021-06-04 ENCOUNTER — Ambulatory Visit: Payer: 59

## 2021-06-04 ENCOUNTER — Encounter: Payer: 59 | Admitting: Family Medicine

## 2022-04-04 ENCOUNTER — Encounter: Payer: 59 | Admitting: Family Medicine

## 2022-04-23 ENCOUNTER — Ambulatory Visit (INDEPENDENT_AMBULATORY_CARE_PROVIDER_SITE_OTHER): Payer: 59 | Admitting: Family Medicine

## 2022-04-23 ENCOUNTER — Encounter: Payer: Self-pay | Admitting: Family Medicine

## 2022-04-23 VITALS — BP 92/61 | HR 78 | Temp 98.8°F | Ht 69.5 in | Wt 175.0 lb

## 2022-04-23 DIAGNOSIS — Z0000 Encounter for general adult medical examination without abnormal findings: Secondary | ICD-10-CM | POA: Diagnosis not present

## 2022-04-23 DIAGNOSIS — E785 Hyperlipidemia, unspecified: Secondary | ICD-10-CM | POA: Diagnosis not present

## 2022-04-23 DIAGNOSIS — R7303 Prediabetes: Secondary | ICD-10-CM | POA: Diagnosis not present

## 2022-04-23 LAB — LIPID PANEL
Cholesterol: 212 mg/dL — ABNORMAL HIGH (ref 0–200)
HDL: 45.9 mg/dL (ref >39.00)
LDL Cholesterol: 132 mg/dL — ABNORMAL HIGH (ref 0–99)
NonHDL: 165.66
Total CHOL/HDL Ratio: 5
Triglycerides: 166 mg/dL — ABNORMAL HIGH (ref 0.0–149.0)
VLDL: 33.2 mg/dL (ref 0.0–40.0)

## 2022-04-23 LAB — CBC
HCT: 46.2 % (ref 39.0–52.0)
Hemoglobin: 15.6 g/dL (ref 13.0–17.0)
MCHC: 33.7 g/dL (ref 30.0–36.0)
MCV: 95.4 fl (ref 78.0–100.0)
Platelets: 209 10*3/uL (ref 150.0–400.0)
RBC: 4.84 Mil/uL (ref 4.22–5.81)
RDW: 12.7 % (ref 11.5–15.5)
WBC: 6.3 10*3/uL (ref 4.0–10.5)

## 2022-04-23 LAB — COMPREHENSIVE METABOLIC PANEL
ALT: 85 U/L — ABNORMAL HIGH (ref 0–53)
AST: 57 U/L — ABNORMAL HIGH (ref 0–37)
Albumin: 4.7 g/dL (ref 3.5–5.2)
Alkaline Phosphatase: 66 U/L (ref 39–117)
BUN: 12 mg/dL (ref 6–23)
CO2: 26 mEq/L (ref 19–32)
Calcium: 9.7 mg/dL (ref 8.4–10.5)
Chloride: 104 mEq/L (ref 96–112)
Creatinine, Ser: 0.76 mg/dL (ref 0.40–1.50)
GFR: 111.12 mL/min (ref >60.00)
Glucose, Bld: 152 mg/dL — ABNORMAL HIGH (ref 70–99)
Potassium: 4.3 mEq/L (ref 3.5–5.1)
Sodium: 138 mEq/L (ref 135–145)
Total Bilirubin: 1 mg/dL (ref 0.2–1.2)
Total Protein: 7.2 g/dL (ref 6.0–8.3)

## 2022-04-23 LAB — TSH: TSH: 0.71 u[IU]/mL (ref 0.35–5.50)

## 2022-04-23 LAB — HEMOGLOBIN A1C: Hgb A1c MFr Bld: 7.3 % — ABNORMAL HIGH (ref 4.6–6.5)

## 2022-04-23 NOTE — Progress Notes (Signed)
Patient ID: Gregory Knight, male  DOB: May 27, 1980, 42 y.o.   MRN: [id number] Patient Care Team    Relationship Specialty Notifications Start End  Ma Hillock, DO PCP - General Family Medicine  02/01/16     Chief Complaint  Patient presents with   Annual Exam    Pt is fasting     Subjective:  Gregory Knight is a 42 y.o. male present for CPE. All past medical history, surgical history, allergies, family history, immunizations, medications and social history were updated in the electronic medical record today. All recent labs, ED visits and hospitalizations within the last year were reviewed.  Abnormal LFT/ hyperlipidemia/FH bile duct cancer/ abnormal [place]:  He stopped his fenofibrate. He is taking his fish oil supplement.    Prediabetes: 5.9>5.8 > 6.4 > 6.2 last time. Tried to cut back on sugar. however he has gained 9 lbs this year   Health maintenance:  Colonoscopy: No FHX colon cancer, bile duct cancer present in > 2 family members at age 86.  Immunizations:  tdap UTD 2015, influenza UTD 2023, covid series completed Infectious disease screening: HIV completed , hep c completed PSA: No results found for: "PSA", pt was counseled on prostate cancer screenings. No fhx. Patient has a Dental home. Hospitalizations/ED visits: reviewed     04/23/2022    8:02 AM 04/03/2021    8:08 AM 11/10/2017    1:59 PM 02/27/2016    8:38 AM 02/01/2016    2:19 PM  Depression screen PHQ 2/9  Decreased Interest 0 0 0 0 0  Down, Depressed, Hopeless 0 0 0 0 0  PHQ - 2 Score 0 0 0 0 0       No data to display                11/10/2017    1:59 PM 02/27/2016    8:38 AM 02/01/2016    2:19 PM  Fall Risk   Falls in the past year? No No No    Immunization History  Administered Date(s) Administered   Influenza,inj,Quad PF,6+ Mos 02/27/2016, 07/24/2017   Influenza-Unspecified 01/19/2014, 05/04/2018, 02/20/2020, 01/19/2021, 01/19/2022   Moderna Sars-Covid-2 Vaccination 07/12/2019, 08/09/2019    PFIZER(Purple Top)SARS-COV-2 Vaccination 03/05/2020   Pfizer Covid-19 Vaccine Bivalent Booster 82yrs & up 02/11/2021   Tdap 04/21/2013   Past Medical History:  Diagnosis Date   Bronchospasm    Medical history non-contributory    No Known Allergies Past Surgical History:  Procedure Laterality Date   CYST REMOVAL TRUNK  2014   back ? epidermal cyst   Family History  Problem Relation Age of Onset   Cancer Father 77       Bile duct    Stroke Paternal Aunt    Cancer Paternal Uncle 14       Bile duct    Social History   Social History Narrative   Married to Gregory Knight, 2 children [place] and [place].    PhD, works as an Marine scientist.    Wears his seatbelt, bicycle helmet.    Exercises routinely.    Smoke detector in the home.   Feels safe in the relationship.         Allergies as of 04/23/2022   No Known Allergies      Medication List        Accurate as of April 23, 2022  8:14 AM. If you have any questions, ask your nurse or doctor.  dorzolamide-timolol 2-0.5 % ophthalmic solution Commonly known as: COSOPT SMARTSIG:In Eye(s)   FISH OIL PO Take by mouth.   latanoprost 0.005 % ophthalmic solution Commonly known as: XALATAN SMARTSIG:In Eye(s)       All past medical history, surgical history, allergies, family history, immunizations andmedications were updated in the EMR today and reviewed under the history and medication portions of their EMR.     No results found for this or any previous visit (from the past 2160 hour(s)).  ROS 14 pt review of systems performed and negative (unless mentioned in an HPI)  Objective: BP 92/61   Pulse 78   Temp 98.8 F (37.1 C) (Oral)   Ht 5' 9.5" (1.765 m)   Wt 175 lb (79.4 kg)   SpO2 96%   BMI 25.47 kg/m  Physical Exam Constitutional:      General: He is not in acute distress.    Appearance: Normal appearance. He is not ill-appearing, toxic-appearing or diaphoretic.  HENT:     Head: Normocephalic and atraumatic.      Right Ear: Tympanic membrane, ear canal and external ear normal. There is no impacted cerumen.     Left Ear: Tympanic membrane, ear canal and external ear normal. There is no impacted cerumen.     Nose: Nose normal. No congestion or rhinorrhea.     Mouth/Throat:     Mouth: Mucous membranes are moist.     Pharynx: Oropharynx is clear. No oropharyngeal exudate or posterior oropharyngeal erythema.  Eyes:     General: No scleral icterus.       Right eye: No discharge.        Left eye: No discharge.     Extraocular Movements: Extraocular movements intact.     Pupils: Pupils are equal, round, and reactive to light.  Cardiovascular:     Rate and Rhythm: Normal rate and regular rhythm.     Pulses: Normal pulses.     Heart sounds: Normal heart sounds. No murmur heard.    No friction rub. No gallop.  Pulmonary:     Effort: Pulmonary effort is normal. No respiratory distress.     Breath sounds: Normal breath sounds. No stridor. No wheezing, rhonchi or rales.  Chest:     Chest wall: No tenderness.  Abdominal:     General: Abdomen is flat. Bowel sounds are normal. There is no distension.     Palpations: Abdomen is soft. There is no mass.     Tenderness: There is no abdominal tenderness. There is no right CVA tenderness, left CVA tenderness, guarding or rebound.     Hernia: No hernia is present.  Musculoskeletal:        General: No swelling or tenderness. Normal range of motion.     Cervical back: Normal range of motion and neck supple.     Right lower leg: No edema.     Left lower leg: No edema.  Lymphadenopathy:     Cervical: No cervical adenopathy.  Skin:    General: Skin is warm and dry.     Coloration: Skin is not jaundiced.     Findings: No bruising, lesion or rash.  Neurological:     General: No focal deficit present.     Mental Status: He is alert and oriented to person, place, and time. Mental status is at baseline.     Cranial Nerves: No cranial nerve deficit.     Sensory:  No sensory deficit.     Motor: No weakness.  Coordination: Coordination normal.     Gait: Gait normal.     Deep Tendon Reflexes: Reflexes normal.  Psychiatric:        Mood and Affect: Mood normal.        Behavior: Behavior normal.        Thought Content: Thought content normal.        Judgment: Judgment normal.     No results found.  Assessment/plan: Kaye Mitro is a 42 y.o. male present for CPE Prediabetes - CBC - Hemoglobin A1c Hyperlipidemia LDL goal <100 - CBC - Comprehensive metabolic panel - Lipid panel - TSH - diet and exercise recommended- he has gained 9 lbs this year  Routine general medical examination at a health care facility Colonoscopy: No FHX colon cancer, bile duct cancer present in > 2 family members at age 39.  Immunizations:  tdap UTD 2015, influenza UTD 2023, covid series completed Infectious disease screening: HIV completed , hep c completed Patient was encouraged to exercise greater than 150 minutes a week. Patient was encouraged to choose a diet filled with fresh fruits and vegetables, and lean meats. AVS provided to patient today for education/recommendation on gender specific health and safety maintenance.  Return in about 1 year (around 04/25/2023) for cpe (20 min).   Orders Placed This Encounter  Procedures   CBC   Comprehensive metabolic panel   Hemoglobin A1c   Lipid panel   TSH   No orders of the defined types were placed in this encounter.  Referral Orders  No referral(s) requested today     Note is dictated utilizing voice recognition software. Although note has been proof read prior to signing, occasional typographical errors still Gregory be missed. If any questions arise, please do not hesitate to call for verification.  Electronically signed by: Howard Pouch, DO [place]

## 2022-04-23 NOTE — Patient Instructions (Signed)
Health Maintenance, Male Adopting a healthy lifestyle and getting preventive care are important in promoting health and wellness. Ask your health care provider about: The right schedule for you to have regular tests and exams. Things you can do on your own to prevent diseases and keep yourself healthy. What should I know about diet, weight, and exercise? Eat a healthy diet  Eat a diet that includes plenty of vegetables, fruits, low-fat dairy products, and lean protein. Do not eat a lot of foods that are high in solid fats, added sugars, or sodium. Maintain a healthy weight Body mass index (BMI) is a measurement that can be used to identify possible weight problems. It estimates body fat based on height and weight. Your health care provider can help determine your BMI and help you achieve or maintain a healthy weight. Get regular exercise Get regular exercise. This is one of the most important things you can do for your health. Most adults should: Exercise for at least 150 minutes each week. The exercise should increase your heart rate and make you sweat (moderate-intensity exercise). Do strengthening exercises at least twice a week. This is in addition to the moderate-intensity exercise. Spend less time sitting. Even light physical activity can be beneficial. Watch cholesterol and blood lipids Have your blood tested for lipids and cholesterol at 43 years of age, then have this test every 5 years. You may need to have your cholesterol levels checked more often if: Your lipid or cholesterol levels are high. You are older than 43 years of age. You are at high risk for heart disease. What should I know about cancer screening? Many types of cancers can be detected early and may often be prevented. Depending on your health history and family history, you may need to have cancer screening at various ages. This may include screening for: Colorectal cancer. Prostate cancer. Skin cancer. Lung  cancer. What should I know about heart disease, diabetes, and high blood pressure? Blood pressure and heart disease High blood pressure causes heart disease and increases the risk of stroke. This is more likely to develop in people who have high blood pressure readings or are overweight. Talk with your health care provider about your target blood pressure readings. Have your blood pressure checked: Every 3-5 years if you are 18-39 years of age. Every year if you are 40 years old or older. If you are between the ages of 65 and 75 and are a current or former smoker, ask your health care provider if you should have a one-time screening for abdominal aortic aneurysm (AAA). Diabetes Have regular diabetes screenings. This checks your fasting blood sugar level. Have the screening done: Once every three years after age 45 if you are at a normal weight and have a low risk for diabetes. More often and at a younger age if you are overweight or have a high risk for diabetes. What should I know about preventing infection? Hepatitis B If you have a higher risk for hepatitis B, you should be screened for this virus. Talk with your health care provider to find out if you are at risk for hepatitis B infection. Hepatitis C Blood testing is recommended for: Everyone born from 1945 through 1965. Anyone with known risk factors for hepatitis C. Sexually transmitted infections (STIs) You should be screened each year for STIs, including gonorrhea and chlamydia, if: You are sexually active and are younger than 43 years of age. You are older than 43 years of age and your   health care provider tells you that you are at risk for this type of infection. Your sexual activity has changed since you were last screened, and you are at increased risk for chlamydia or gonorrhea. Ask your health care provider if you are at risk. Ask your health care provider about whether you are at high risk for HIV. Your health care provider  may recommend a prescription medicine to help prevent HIV infection. If you choose to take medicine to prevent HIV, you should first get tested for HIV. You should then be tested every 3 months for as long as you are taking the medicine. Follow these instructions at home: Alcohol use Do not drink alcohol if your health care provider tells you not to drink. If you drink alcohol: Limit how much you have to 0-2 drinks a day. Know how much alcohol is in your drink. In the [place] one drink equals one 12 oz bottle of beer (355 mL), one 5 oz glass of wine (148 mL), or one 1 oz glass of hard liquor (44 mL). Lifestyle Do not use any products that contain nicotine or tobacco. These products include cigarettes, chewing tobacco, and vaping devices, such as e-cigarettes. If you need help quitting, ask your health care provider. Do not use street drugs. Do not share needles. Ask your health care provider for help if you need support or information about quitting drugs. General instructions Schedule regular health, dental, and eye exams. Stay current with your vaccines. Tell your health care provider if: You often feel depressed. You have ever been abused or do not feel safe at home. Summary Adopting a healthy lifestyle and getting preventive care are important in promoting health and wellness. Follow your health care provider's instructions about healthy diet, exercising, and getting tested or screened for diseases. Follow your health care provider's instructions on monitoring your cholesterol and blood pressure. This information is not intended to replace advice given to you by your health care provider. Make sure you discuss any questions you have with your health care provider. Document Revised: 08/27/2020 Document Reviewed: 08/27/2020 Elsevier Patient Education  2023 [employer]  

## 2022-04-24 ENCOUNTER — Telehealth: Payer: Self-pay | Admitting: Family Medicine

## 2022-04-24 DIAGNOSIS — E119 Type 2 diabetes mellitus without complications: Secondary | ICD-10-CM | POA: Insufficient documentation

## 2022-04-24 MED ORDER — METFORMIN HCL 500 MG PO TABS: 500.0000 mg | ORAL_TABLET | Freq: Two times a day (BID) | ORAL | 1 refills | Status: DC

## 2022-04-24 NOTE — Telephone Encounter (Signed)
Please call patient kidney and thyroid function are normal Blood cell counts and electrolytes are normal Cholesterol panel is about the same as last year, but his triglycerides improved. Continue fenofibrate  Your enzymes are mildly elevated, this is common for him with fasting labs.  Not concerning at this level.  Unfortunately, his A1c has increased to 7.3.  This is now in the diabetic range. I have called in the first-line medication for new onset diabetes which is called metformin for him to take twice daily with food.  I would recommend he avoid high sugar content foods/drinks.  Please have him follow-up in 2-4 weeks for diabetic education/counseling with provider.

## 2022-04-24 NOTE — Telephone Encounter (Signed)
LM for pt to return call to discuss.  

## 2022-04-25 NOTE — Telephone Encounter (Signed)
LM for pt to return call to discuss.  

## 2022-05-29 ENCOUNTER — Encounter: Payer: Self-pay | Admitting: Family Medicine

## 2022-05-30 NOTE — Telephone Encounter (Signed)
Please ask patient to schedule appointment if he would like to discuss starting Viagra.

## 2022-05-30 NOTE — Telephone Encounter (Signed)
Please advise 

## 2022-10-25 ENCOUNTER — Other Ambulatory Visit: Payer: Self-pay | Admitting: Family Medicine

## 2023-02-27 ENCOUNTER — Ambulatory Visit (INDEPENDENT_AMBULATORY_CARE_PROVIDER_SITE_OTHER): Payer: 59 | Admitting: Family Medicine

## 2023-02-27 VITALS — BP 102/70 | HR 75 | Temp 98.2°F | Ht 69.29 in | Wt 163.8 lb

## 2023-02-27 DIAGNOSIS — R748 Abnormal levels of other serum enzymes: Secondary | ICD-10-CM

## 2023-02-27 DIAGNOSIS — E785 Hyperlipidemia, unspecified: Secondary | ICD-10-CM

## 2023-02-27 DIAGNOSIS — Z7984 Long term (current) use of oral hypoglycemic drugs: Secondary | ICD-10-CM

## 2023-02-27 DIAGNOSIS — K760 Fatty (change of) liver, not elsewhere classified: Secondary | ICD-10-CM | POA: Diagnosis not present

## 2023-02-27 DIAGNOSIS — F524 Premature ejaculation: Secondary | ICD-10-CM | POA: Insufficient documentation

## 2023-02-27 DIAGNOSIS — E119 Type 2 diabetes mellitus without complications: Secondary | ICD-10-CM | POA: Diagnosis not present

## 2023-02-27 DIAGNOSIS — Z23 Encounter for immunization: Secondary | ICD-10-CM

## 2023-02-27 LAB — POCT GLYCOSYLATED HEMOGLOBIN (HGB A1C)
HbA1c POC (<> result, manual entry): 6.2 % (ref 4.0–5.6)
HbA1c, POC (controlled diabetic range): 6.5 % (ref 0.0–7.0)
HbA1c, POC (prediabetic range): 6.2 % (ref 5.7–6.4)
Hemoglobin A1C: 6.2 % (ref 4.0–5.6)

## 2023-02-27 LAB — MICROALBUMIN / CREATININE URINE RATIO
Creatinine,U: 143.1 mg/dL
Microalb Creat Ratio: 0.9 mg/g (ref 0.0–30.0)
Microalb, Ur: 1.3 mg/dL (ref 0.0–1.9)

## 2023-02-27 MED ORDER — ROSUVASTATIN CALCIUM 5 MG PO TABS: 5.0000 mg | ORAL_TABLET | Freq: Every day | ORAL | 3 refills | Status: AC

## 2023-02-27 MED ORDER — SILDENAFIL CITRATE 25 MG PO TABS: 25.0000 mg | ORAL_TABLET | Freq: Every day | ORAL | 2 refills | Status: AC | PRN

## 2023-02-27 MED ORDER — MUPIROCIN 2 % EX OINT: 1.0000 | TOPICAL_OINTMENT | Freq: Every evening | CUTANEOUS | 5 refills | Status: AC | PRN

## 2023-02-27 MED ORDER — METFORMIN HCL 500 MG PO TABS: 500.0000 mg | ORAL_TABLET | Freq: Two times a day (BID) | ORAL | 2 refills | Status: AC

## 2023-02-27 NOTE — Patient Instructions (Addendum)
Return in about 12 weeks (around 05/22/2023) for cpe (20 min), Routine chronic condition follow-up.        Great to see you today.  I have refilled the medication(s) we provide.   If labs were collected or images ordered, we will inform you of  results once we have received them and reviewed. We will contact you either by echart message, or telephone call.  Please give ample time to the testing facility, and our office to run,  receive and review results. Please do not call inquiring of results, even if you can see them in your chart. We will contact you as soon as we are able. If it has been over 1 week since the test was completed, and you have not yet heard from [place], then please call us.    - echart message- for normal results that have been seen by the patient already.   - telephone call: abnormal results or if patient has not viewed results in their echart.  If a referral to a specialist was entered for you, please call us in 2 weeks if you have not heard from the specialist office to schedule.

## 2023-02-27 NOTE — Progress Notes (Signed)
Gregory Knight , 12/06/1980, 42 y.o., male MRN: [id number] Patient Care Team    Relationship Specialty Notifications Start End  Natalia Leatherwood, DO PCP - General Family Medicine  02/01/16   Jill Side, OD Referring Physician   02/27/23     Chief Complaint  Patient presents with   Diabetes    Eye exam requested from sarah barts     Subjective: Gregory Knight is a 42 y.o. Pt presents for an OV to follow-up on chronic condition.  Patient was diagnosed with new onset diabetes in January at his physical.  Metformin was prescribed and patient was encouraged to return for diabetic education within 4 weeks.  Patient has not followed up since his diagnosis in January.  Type 2 diabetes/hyperlipidemia: Pt reports compliance with metformin 500 mg twice daily. Denies numbness, tingling of extremities, hypo/hyperglycemic events or non-healing wounds.  He is currently not prescribed a statin.  Premature ejaculation: Patient has noticed over the last 3-6 months he is experiencing premature ejaculation.  He is able to obtain an erection.  He has no history of heart disease, MI or hypotension.     02/27/2023    8:26 AM 04/23/2022    8:02 AM 04/03/2021    8:08 AM 11/10/2017    1:59 PM 02/27/2016    8:38 AM  Depression screen PHQ 2/9  Decreased Interest 0 0 0 0 0  Down, Depressed, Hopeless 0 0 0 0 0  PHQ - 2 Score 0 0 0 0 0    No Known Allergies Social History   Social History Narrative   Married to can [place], 2 children Zhiyi and [place].    PhD, works as an Dance movement psychotherapist.    Wears his seatbelt, bicycle helmet.    Exercises routinely.    Smoke detector in the home.   Feels safe in the relationship.        Past Medical History:  Diagnosis Date   Bronchospasm    Medical history non-contributory    Past Surgical History:  Procedure Laterality Date   CYST REMOVAL TRUNK  2014   back ? epidermal cyst   Family History  Problem Relation Age of Onset   Cancer Father 63       Bile duct     Stroke Paternal Aunt    Cancer Paternal Uncle 52       Bile duct    Allergies as of 02/27/2023   No Known Allergies      Medication List        Accurate as of February 27, 2023 11:12 AM. If you have any questions, ask your nurse or doctor.          STOP taking these medications    FISH OIL PO Stopped by: Felix Pacini       TAKE these medications    dorzolamide-timolol 2-0.5 % ophthalmic solution Commonly known as: COSOPT SMARTSIG:In Eye(s)   latanoprost 0.005 % ophthalmic solution Commonly known as: XALATAN SMARTSIG:In Eye(s)   metFORMIN 500 MG tablet Commonly known as: GLUCOPHAGE Take 1 tablet (500 mg total) by mouth 2 (two) times daily with a meal.   mupirocin ointment 2 % Commonly known as: BACTROBAN Apply 1 Application topically at bedtime as needed. Apply to bilateral nostrils at night Started by: Felix Pacini   rosuvastatin 5 MG tablet Commonly known as: Crestor Take 1 tablet (5 mg total) by mouth at bedtime. Started by: Felix Pacini   sildenafil 25 MG tablet Commonly  known as: VIAGRA Take 1 tablet (25 mg total) by mouth daily as needed for erectile dysfunction. 1 hour prior to intercourse Started by: Felix Pacini        All past medical history, surgical history, allergies, family history, immunizations andmedications were updated in the EMR today and reviewed under the history and medication portions of their EMR.     ROS Negative, with the exception of above mentioned in HPI   Objective:  BP 102/70   Pulse 75   Temp 98.2 F (36.8 C)   Ht 5' 9.29" (1.76 m)   Wt 163 lb 12.8 oz (74.3 kg)   SpO2 96%   BMI 23.99 kg/m  Body mass index is 23.99 kg/m. Physical Exam Vitals and nursing note reviewed.  Constitutional:      General: He is not in acute distress.    Appearance: Normal appearance. He is not ill-appearing, toxic-appearing or diaphoretic.  HENT:     Head: Normocephalic and atraumatic.  Eyes:     General: No scleral icterus.        Right eye: No discharge.        Left eye: No discharge.     Extraocular Movements: Extraocular movements intact.     Pupils: Pupils are equal, round, and reactive to light.  Cardiovascular:     Rate and Rhythm: Normal rate and regular rhythm.  Pulmonary:     Effort: Pulmonary effort is normal. No respiratory distress.     Breath sounds: Normal breath sounds. No wheezing, rhonchi or rales.  Musculoskeletal:     Right lower leg: No edema.     Left lower leg: No edema.  Skin:    General: Skin is warm.     Findings: No rash.  Neurological:     Mental Status: He is alert and oriented to person, place, and time. Mental status is at baseline.  Psychiatric:        Mood and Affect: Mood normal.        Behavior: Behavior normal.        Thought Content: Thought content normal.        Judgment: Judgment normal.    Diabetic Foot Exam - Simple   Simple Foot Form Diabetic Foot exam was performed with the following findings: Yes 02/27/2023  8:40 AM  Visual Inspection No deformities, no ulcerations, no other skin breakdown bilaterally: Yes Sensation Testing Intact to touch and monofilament testing bilaterally: Yes Pulse Check Posterior Tibialis and Dorsalis pulse intact bilaterally: Yes Comments      No results found. No results found. Results for orders placed or performed in visit on 02/27/23 (from the past 24 hour(s))  POCT glycosylated hemoglobin (Hb A1C)     Status: Abnormal   Collection Time: 02/27/23  8:19 AM  Result Value Ref Range   Hemoglobin A1C 6.2 4.0 - 5.6 %   HbA1c POC (<> result, manual entry) 6.2 4.0 - 5.6 %   HbA1c, POC (prediabetic range) 6.2 5.7 - 6.4 %   HbA1c, POC (controlled diabetic range) 6.5 0.0 - 7.0 %    Assessment/Plan: Gregory Knight is a 42 y.o. male present for OV for chronic condition management Diabetes/hyperlipidemia LDL goal <100 Improved. continue metformin 500 mg BID Agreeable to start crestor 5 mg qhs Diabetic diet encouraged Routine  exercise encouraged PNA20: administered today Flu shot: UTD 2024 (recommneded yearly) Foot exam: Completed 02/27/2023 Eye exam: UTD 2024, Dr. Cherlynn Polo Microalb: collected today A1c: Diagnosis 04/2022-A1c 7.3>6.2 A1c collected today  Hepatic steatosis/liver  enzymes elevated Stable. Fluctuates.   Premature ejaculation We discussed different ejaculatory disorders today and treatment options. Prefers to not take a daily SSRI at this time, but would like to try Viagra. Viagra 25 mg 1 hour prior to intercourse as needed  Reviewed expectations re: course of current medical issues. Discussed self-management of symptoms. Outlined signs and symptoms indicating need for more acute intervention. Patient verbalized understanding and all questions were answered. Patient received an After-Visit Summary.    Orders Placed This Encounter  Procedures   Pneumococcal conjugate vaccine 20-valent   Microalbumin / creatinine urine ratio   POCT glycosylated hemoglobin (Hb A1C)   Meds ordered this encounter  Medications   metFORMIN (GLUCOPHAGE) 500 MG tablet    Sig: Take 1 tablet (500 mg total) by mouth 2 (two) times daily with a meal.    Dispense:  180 tablet    Refill:  2   rosuvastatin (CRESTOR) 5 MG tablet    Sig: Take 1 tablet (5 mg total) by mouth at bedtime.    Dispense:  90 tablet    Refill:  3   mupirocin ointment (BACTROBAN) 2 %    Sig: Apply 1 Application topically at bedtime as needed. Apply to bilateral nostrils at night    Dispense:  22 g    Refill:  5   sildenafil (VIAGRA) 25 MG tablet    Sig: Take 1 tablet (25 mg total) by mouth daily as needed for erectile dysfunction. 1 hour prior to intercourse    Dispense:  10 tablet    Refill:  2   Referral Orders  No referral(s) requested today     Note is dictated utilizing voice recognition software. Although note has been proof read prior to signing, occasional typographical errors still can be missed. If any questions arise, please do  not hesitate to call for verification.   electronically signed by:  Felix Pacini, DO  [facility] [place]

## 2023-04-27 ENCOUNTER — Encounter: Payer: 59 | Admitting: Family Medicine

## 2023-05-28 ENCOUNTER — Encounter: Payer: 59 | Admitting: Family Medicine

## 2023-06-30 LAB — HM DIABETES EYE EXAM

## 2023-08-17 ENCOUNTER — Encounter: Payer: 59 | Admitting: Family Medicine

## 2023-09-10 ENCOUNTER — Encounter: Admitting: Family Medicine
